# Patient Record
Sex: Male | Born: 1940 | Hispanic: No | Marital: Single | Smoking: Former smoker
Health system: Southern US, Community
[De-identification: ages and names within clinical notes are randomized; demographics above are authoritative.]

## PROBLEM LIST (undated history)

## (undated) DIAGNOSIS — G14 Postpolio syndrome: Secondary | ICD-10-CM

## (undated) DIAGNOSIS — E785 Hyperlipidemia, unspecified: Secondary | ICD-10-CM

## (undated) DIAGNOSIS — I10 Essential (primary) hypertension: Secondary | ICD-10-CM

## (undated) DIAGNOSIS — D649 Anemia, unspecified: Secondary | ICD-10-CM

## (undated) DIAGNOSIS — I2510 Atherosclerotic heart disease of native coronary artery without angina pectoris: Secondary | ICD-10-CM

## (undated) HISTORY — DX: Anemia, unspecified: D64.9

## (undated) HISTORY — DX: Essential (primary) hypertension: I10

## (undated) HISTORY — DX: Hyperlipidemia, unspecified: E78.5

## (undated) HISTORY — DX: Postpolio syndrome: G14

## (undated) HISTORY — PX: OTHER SURGICAL HISTORY: SHX169

## (undated) HISTORY — DX: Atherosclerotic heart disease of native coronary artery without angina pectoris: I25.10

---

## 2007-07-10 ENCOUNTER — Encounter: Payer: Self-pay | Admitting: Cardiology

## 2009-06-15 ENCOUNTER — Encounter: Payer: Self-pay | Admitting: Cardiology

## 2009-09-02 ENCOUNTER — Encounter: Payer: Self-pay | Admitting: Cardiology

## 2009-09-29 ENCOUNTER — Encounter: Payer: Self-pay | Admitting: Cardiology

## 2010-06-06 ENCOUNTER — Ambulatory Visit: Payer: Self-pay | Admitting: Cardiology

## 2010-06-06 ENCOUNTER — Encounter: Payer: Self-pay | Admitting: Cardiology

## 2010-06-06 DIAGNOSIS — I2510 Atherosclerotic heart disease of native coronary artery without angina pectoris: Secondary | ICD-10-CM

## 2010-06-06 DIAGNOSIS — I10 Essential (primary) hypertension: Secondary | ICD-10-CM | POA: Insufficient documentation

## 2010-06-06 DIAGNOSIS — E782 Mixed hyperlipidemia: Secondary | ICD-10-CM

## 2010-06-13 ENCOUNTER — Ambulatory Visit: Payer: Self-pay | Admitting: Cardiology

## 2010-06-20 LAB — CONVERTED CEMR LAB
ALT: 15 units/L (ref 0–53)
AST: 19 units/L (ref 0–37)
BUN: 32 mg/dL — ABNORMAL HIGH (ref 6–23)
Bilirubin, Direct: 0.1 mg/dL (ref 0.0–0.3)
CO2: 26 meq/L (ref 19–32)
Calcium: 9.2 mg/dL (ref 8.4–10.5)
GFR calc non Af Amer: 47.02 mL/min — ABNORMAL LOW (ref >60.00)
Glucose, Bld: 81 mg/dL (ref 70–99)
Sodium: 145 meq/L (ref 135–145)
Total Bilirubin: 0.4 mg/dL (ref 0.3–1.2)
Total CHOL/HDL Ratio: 3

## 2010-08-02 NOTE — Assessment & Plan Note (Signed)
Summary: NP6/ H/O PRIOR MI , PRIOR LAD STENTING. PT HAS MEDICARE. GD   Visit Type:  new pt visit Primary Provider:  Dr. Leilani Able  CC:  pt offers no cardiac complaints today.  History of Present Illness: 69 yo with history of CAD s/p LAD PCI and diabetes mellitus presents to establish cardiology care after moving to [facility]  Patient had a NSTEMI Filbert 2008 with tandem severe LAD stenoses that were treated with Xience drug eluting stents.  Since that time, he has done well.  Last myoview was Kemoni 3/11 and showed no evidence of ischemia or infarction per report.  Patient has had no recent chest pain.  He has no exertional dyspnea but is not particularly active.  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  He rides a stationary bike and occasionally plays golf.    ECG: NSR, normal  Labs (12/10): LDL 54, HDL 54, creatinine 1.6  Current Medications (verified): 1)  Plavix 75 Mg Tabs (Clopidogrel Bisulfate) .[name] Kitchen.. 1 Tab Once Daily 2)  Onglyza 2.5 Mg Tabs (Saxagliptin Hcl) .[name] Kitchen.. 1 Tab Once Daily 3)  Aspirin 81 Mg Tbec (Aspirin) .... Take One Tablet By Mouth Daily 4)  Amlodipine Besy-Benazepril Hcl 5-20 Mg Caps (Amlodipine Besy-Benazepril Hcl) .[name] Kitchen.. 1 Cap Once Daily 5)  Metoprolol Tartrate 50 Mg Tabs (Metoprolol Tartrate) .[name] Kitchen.. 1 Tab Once Daily 6)  Glipizide 5 Mg Tabs (Glipizide) .[name] Kitchen.. 1 Tab Once Daily 7)  Simvastatin 20 Mg Tabs (Simvastatin) .[name] Kitchen.. 1 Tab Once Daily  Allergies (verified): No Known Drug Allergies  Past History:  Past Medical History: 1. CAD: NSTEMI 2008 with tandem severe mid LAD stenoses treated with Xience V drug eluting stents.  Lexiscan myovew Jeshurun 3/11 showed EF 58%, no ischemia or infarction.  2. Hyperlipidemia 3. Diabetes mellitus 4. Post-polio syndrome with leg weakness 5. HTN  Family History: No premature CAD  Social History: Lives [place] with family Retired from [employer]  Review of Systems       All systems reviewed and  negative except as per HPI.   Vital Signs:  Patient profile:   70 year old male Height:      65 inches Weight:      145.50 pounds BMI:     24.30 Pulse rate:   70 / minute Pulse rhythm:   regular BP sitting:   144 / 70  (left arm) Cuff size:   regular  Vitals Entered By: Danielle Rankin, CMA (June 06, 2010 11:56 AM)  Physical Exam  General:  Well developed, well nourished, Peniel no acute distress. Head:  normocephalic and atraumatic Nose:  no deformity, discharge, inflammation, or lesions Mouth:  Teeth, gums and palate normal. Oral mucosa normal. Neck:  Neck supple, no JVD. No masses, thyromegaly or abnormal cervical nodes. Lungs:  Clear bilaterally to auscultation and percussion. Heart:  Non-displaced PMI, chest non-tender; regular rate and rhythm, S1, S2 without murmurs, rubs or gallops. Carotid upstroke normal, no bruit.  Pedals normal pulses. No edema, no varicosities. Abdomen:  Bowel sounds positive; abdomen soft and non-tender without masses, organomegaly, or hernias noted. No hepatosplenomegaly. Msk:  lower leg braces Extremities:  No clubbing or cyanosis. Neurologic:  Alert and oriented x 3. Skin:  Intact without lesions or rashes. Psych:  Normal affect.   Impression & Recommendations:  Problem # 1:  CAD, NATIVE VESSEL (ICD-414.01) Patient had NSTEMI with 2 drug eluting stents Arihaan the mid LAD.  His prior cardiologist recommended that he continue Plavix long-term (has been  on it since 2008), so I will continue it at this time.  No ischemic symptoms.  Normal Lexiscan myoview Dominico 3/11.  Preserved EF.  I will continue current meds: ASA, Plavix, ACEI, statin, metoprolol.   Problem # 2:  HYPERLIPIDEMIA-MIXED (ICD-272.4) Check lipids/LFTs, goal LDL < 70.   Problem # 3:  HYPERTENSION, UNSPECIFIED (ICD-401.9) BP is running on the high side.  He has only been taking metoprolol 50 mg once daily.  This should be increased to twice daily.   Patient Instructions: 1)  Your physician  has recommended you make the following change Jazier your medication:  2)  Take Metoprolol 50mg  twice a day. 3)  Your physician recommends that you return for a FASTING lipid profile/liver profile/BMP 414.01 4)  Your physician wants you to follow-up Sailor: 6 months with Dr Shirlee Latch. Joycie Peek 2012)  You will receive a reminder letter Danyael the mail two months [place] advance. If you don't receive a letter, please call our office to schedule the follow-up appointment. Prescriptions: SIMVASTATIN 20 MG TABS (SIMVASTATIN) 1 tab once daily  #30 x 6   Entered by:   Katina Dung, RN, BSN   Authorized by:   Marca Ancona, MD   Signed by:   Katina Dung, RN, BSN on 06/06/2010   Method used:   Electronically to        [facility] (retail)       [phone]       [place], [place]  [address]       Ph: [phone]       Fax: [phone]   RxID:   [id number] AMLODIPINE BESY-BENAZEPRIL HCL 5-20 MG CAPS (AMLODIPINE BESY-BENAZEPRIL HCL) 1 cap once daily  #30 x 6   Entered by:   Katina Dung, RN, BSN   Authorized by:   Marca Ancona, MD   Signed by:   Katina Dung, RN, BSN on 06/06/2010   Method used:   Electronically to        [facility] (retail)       [phone]       [place], [place]  [address]       Ph: [phone]       Fax: [phone]   RxID:   [id number] PLAVIX 75 MG TABS (CLOPIDOGREL BISULFATE) 1 tab once daily  #30 x 6   Entered by:   Katina Dung, RN, BSN   Authorized by:   Marca Ancona, MD   Signed by:   Katina Dung, RN, BSN on 06/06/2010   Method used:   Electronically to        [facility] (retail)       [phone]       [place], [place]  [address]       Ph: [phone]       Fax: [phone]   RxID:   [id number] METOPROLOL TARTRATE 50 MG TABS (METOPROLOL TARTRATE) one twice a day  #60 x 6   Entered by:   Katina Dung, RN, BSN    Authorized by:   Marca Ancona, MD   Signed by:   Katina Dung, RN, BSN on 06/06/2010   Method used:   Electronically to        [facility] (retail)       [phone]  [place], [place]  [address]       Ph: [phone]       Fax: [phone]   RxID:   [id number]

## 2010-08-04 NOTE — Letter (Signed)
Summary: [facility] 2008 to 2011  [facility] 2008 to 2011   Imported By: Marylou Mccoy 06/16/2010 12:44:48  _____________________________________________________________________  External Attachment:    Type:   Image     Comment:   External Document

## 2010-08-04 NOTE — Letter (Signed)
Summary: [facility] Office Visit Note   [facility] Office Visit Note   Imported By: Roderic Ovens 06/24/2010 13:55:19  _____________________________________________________________________  External Attachment:    Type:   Image     Comment:   External Document

## 2010-12-02 ENCOUNTER — Encounter: Payer: Self-pay | Admitting: Cardiology

## 2010-12-20 ENCOUNTER — Encounter: Payer: Self-pay | Admitting: Cardiology

## 2010-12-20 ENCOUNTER — Ambulatory Visit (INDEPENDENT_AMBULATORY_CARE_PROVIDER_SITE_OTHER): Payer: Medicare Other | Admitting: Cardiology

## 2010-12-20 VITALS — BP 144/78 | HR 58 | Resp 18 | Ht 65.0 in | Wt 148.8 lb

## 2010-12-20 DIAGNOSIS — E785 Hyperlipidemia, unspecified: Secondary | ICD-10-CM

## 2010-12-20 DIAGNOSIS — I2510 Atherosclerotic heart disease of native coronary artery without angina pectoris: Secondary | ICD-10-CM

## 2010-12-20 DIAGNOSIS — I10 Essential (primary) hypertension: Secondary | ICD-10-CM

## 2010-12-20 NOTE — Patient Instructions (Signed)
Start Fish Oil 2000mg  daily--this will be two 1000mg  capsules daily.  Start  a multiple vitamin every day.  Schedule an appointment for fasting lab Henry Turner the next week or so--lipid profile/BMP 414.01  Your physician wants you to follow-up Henry Turner: 1 year with Henry Turner. (June 2013). You will receive a reminder letter Henry [place] the mail two months Henry Turner advance. If you don't receive a letter, please call our office to schedule the follow-up appointment.

## 2010-12-22 NOTE — Assessment & Plan Note (Signed)
Patient had NSTEMI with 2 drug eluting stents Henry Turner the mid LAD.  His prior cardiologist recommended that he continue Plavix long-term (has been on it since 2008), so I will continue it at this time.  No ischemic symptoms.  Normal Lexiscan myoview Brandn 3/11.  Preserved EF.  I will continue current meds: ASA, Plavix, ACEI, statin, metoprolol.

## 2010-12-22 NOTE — Assessment & Plan Note (Signed)
Mild BP elevation today, good control at home.  Continue current meds.

## 2010-12-22 NOTE — Assessment & Plan Note (Signed)
Check lipids, goal LDL < 70.  

## 2010-12-22 NOTE — Progress Notes (Signed)
PCP: Dr. Pecola Leisure  70 yo with history of CAD s/p LAD PCI and diabetes mellitus presents for cardiology followup.  Patient had a NSTEMI Edu 2008 with tandem severe LAD stenoses that were treated with Xience drug eluting stents.  Since that time, he has done well.  Last myoview was [name] 3/11 and showed no evidence of ischemia or infarction per report.  Patient has had no recent chest pain.  He has no exertional dyspnea.  He rides a stationary bike and plays golf.  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  BP is borderline elevated today but systolic BP runs 161W-960A at home.  ECG: NSR, mild bradycardia with rate 50.    Labs (12/10): LDL 54, HDL 54, creatinine 1.6 Labs (12/11): LDL 68, HDL 39, K 4.9, creatinine 1.6  Allergies (verified):  No Known Drug Allergies  Past Medical History: 1. CAD: NSTEMI 2008 with tandem severe mid LAD stenoses treated with Xience V drug eluting stents.  Lexiscan myovew [name] 3/11 showed EF 58%, no ischemia or infarction.  2. Hyperlipidemia 3. Diabetes mellitus 4. Post-polio syndrome with leg weakness 5. HTN 6. CKD: Creatinine around 1.6 chronically.   Family History: No premature CAD  Social History: Lives [place] with family Retired from [employer]  Current Outpatient Prescriptions  Medication Sig Dispense Refill  . amLODipine-benazepril (LOTREL) 5-20 MG per capsule Take 1 capsule by mouth daily.        [name] Kitchen aspirin 81 MG tablet Take 81 mg by mouth daily.        . clopidogrel (PLAVIX) 75 MG tablet Take 75 mg by mouth daily.        [name] Kitchen glipiZIDE (GLUCOTROL) 5 MG tablet Take 5 mg by mouth daily.        . metoprolol (LOPRESSOR) 50 MG tablet Take 50 mg by mouth 2 (two) times daily.        . saxagliptin HCl (ONGLYZA) 2.5 MG TABS tablet Take 2.5 mg by mouth daily.        . simvastatin (ZOCOR) 20 MG tablet Take 20 mg by mouth daily.        . fish oil-omega-3 fatty acids 1000 MG capsule Take 2 capsules (2 g total) by mouth daily.      .  Multiple Vitamins tablet Take 1 tablet by mouth daily.        BP 144/78  Pulse 58  Resp 18  Ht 5\' 5"  (1.651 m)  Wt 148 lb 12.8 oz (67.495 kg)  BMI 24.76 kg/m2 General: NAD Neck: No JVD, no thyromegaly or thyroid nodule.  Lungs: Clear to auscultation bilaterally with normal respiratory effort. CV: Nondisplaced PMI.  Heart regular S1/S2, no S3/S4, no murmur.  No peripheral edema.  No carotid bruit.  Normal pedal pulses.  Abdomen: Soft, nontender, no hepatosplenomegaly, no distention.  Neurologic: Alert and oriented x 3.  Psych: Normal affect. Extremities: No clubbing or cyanosis.

## 2010-12-27 ENCOUNTER — Telehealth: Payer: Self-pay | Admitting: *Deleted

## 2010-12-27 ENCOUNTER — Other Ambulatory Visit (INDEPENDENT_AMBULATORY_CARE_PROVIDER_SITE_OTHER): Payer: Medicare Other | Admitting: *Deleted

## 2010-12-27 ENCOUNTER — Other Ambulatory Visit: Payer: Medicare Other

## 2010-12-27 DIAGNOSIS — I2510 Atherosclerotic heart disease of native coronary artery without angina pectoris: Secondary | ICD-10-CM

## 2010-12-27 DIAGNOSIS — E875 Hyperkalemia: Secondary | ICD-10-CM

## 2010-12-27 DIAGNOSIS — R0989 Other specified symptoms and signs involving the circulatory and respiratory systems: Secondary | ICD-10-CM

## 2010-12-27 LAB — BASIC METABOLIC PANEL
BUN: 41 mg/dL — ABNORMAL HIGH (ref 6–23)
CO2: 27 mEq/L (ref 19–32)
Chloride: 108 mEq/L (ref 96–112)
Chloride: 109 mEq/L (ref 96–112)
Creatinine, Ser: 1.9 mg/dL — ABNORMAL HIGH (ref 0.4–1.5)
GFR: 36.07 mL/min — ABNORMAL LOW (ref >60.00)
Glucose, Bld: 236 mg/dL — ABNORMAL HIGH (ref 70–99)
Potassium: 6.2 mEq/L (ref 3.5–5.1)
Potassium: 5.2 mEq/L — ABNORMAL HIGH (ref 3.5–5.1)
Sodium: 141 mEq/L (ref 135–145)
Sodium: 142 mEq/L (ref 135–145)

## 2010-12-27 LAB — LIPID PANEL
LDL Cholesterol: 65 mg/dL (ref 0–99)
Total CHOL/HDL Ratio: 3
Triglycerides: 91 mg/dL (ref 0.0–149.0)

## 2010-12-27 NOTE — Telephone Encounter (Signed)
Notes Recorded by Jacqlyn Krauss, RN on 12/27/2010 at 2:47 PM I talked with Clydie Braun at [facility]. Pt can return for repeat STAT BMP to [facility]. I talked with pt. Pt states he will return to [facility] now. It will take him about 20 minutes to get there. He will hold Amlodipine/benazepril. Notes Recorded by Marca Ancona, MD on 12/27/2010 at 2:37 PM K 6.2 and creatinine 1.9. Re-run BMET stat. Hold benazepril. Will need to be treated with Kayexalate and get ECG if this is truly high.

## 2010-12-28 ENCOUNTER — Telehealth: Payer: Self-pay | Admitting: Cardiology

## 2010-12-28 DIAGNOSIS — I10 Essential (primary) hypertension: Secondary | ICD-10-CM

## 2010-12-28 DIAGNOSIS — I2510 Atherosclerotic heart disease of native coronary artery without angina pectoris: Secondary | ICD-10-CM

## 2010-12-28 MED ORDER — AMLODIPINE BESYLATE 10 MG PO TABS: 10.0000 mg | ORAL_TABLET | Freq: Every day | ORAL | Status: DC

## 2010-12-28 NOTE — Telephone Encounter (Signed)
tes Recorded by Jacqlyn Krauss, RN on 12/28/2010 at 3:05 PM I talked with pt. He will stop amlodipine/benazepril and start amlodipine 10mg  daily. He will return for [facility] 01/11/11. Notes Recorded by Jacqlyn Krauss, RN on 12/28/2010 at 10:50 AM I talked with pt. He was unsure of the medication he should stop. He will call me back today when he is at home so I can review his medication with him. Notes Recorded by Marca Ancona, MD on 12/28/2010 at 8:54 AM Stop amlodipine/benazepril, start amlodipine 10 mg daily. Repeat BMET Seymour 2 wks

## 2010-12-28 NOTE — Telephone Encounter (Signed)
Message copied by Jacqlyn Krauss on Wed Dec 28, 2010  3:06 PM ------      Message from: Laurey Morale      Created: Wed Dec 28, 2010  8:54 AM       Stop amlodipine/benazepril, start amlodipine 10 mg daily.  Repeat BMET Shawnn 2 wks.

## 2010-12-28 NOTE — Telephone Encounter (Signed)
Retuning call back to anne.

## 2011-01-11 ENCOUNTER — Other Ambulatory Visit (INDEPENDENT_AMBULATORY_CARE_PROVIDER_SITE_OTHER): Payer: Medicare Other | Admitting: *Deleted

## 2011-01-11 ENCOUNTER — Other Ambulatory Visit: Payer: Self-pay | Admitting: Cardiology

## 2011-01-11 DIAGNOSIS — I10 Essential (primary) hypertension: Secondary | ICD-10-CM

## 2011-01-11 DIAGNOSIS — I2510 Atherosclerotic heart disease of native coronary artery without angina pectoris: Secondary | ICD-10-CM

## 2011-01-11 LAB — BASIC METABOLIC PANEL
CO2: 28 mEq/L (ref 19–32)
Calcium: 9.3 mg/dL (ref 8.4–10.5)
Glucose, Bld: 81 mg/dL (ref 70–99)
Sodium: 143 mEq/L (ref 135–145)

## 2011-01-23 ENCOUNTER — Other Ambulatory Visit: Payer: Self-pay | Admitting: Cardiology

## 2011-04-19 ENCOUNTER — Other Ambulatory Visit: Payer: Self-pay | Admitting: Family Medicine

## 2011-04-19 DIAGNOSIS — M79606 Pain in leg, unspecified: Secondary | ICD-10-CM

## 2011-04-20 ENCOUNTER — Ambulatory Visit
Admission: RE | Admit: 2011-04-20 | Discharge: 2011-04-20 | Disposition: A | Discharge status: Home / Self Care | Payer: Medicare Other | Source: Ambulatory Visit | Attending: Family Medicine | Admitting: Family Medicine

## 2011-04-20 DIAGNOSIS — M79606 Pain in leg, unspecified: Secondary | ICD-10-CM

## 2011-05-30 ENCOUNTER — Telehealth: Payer: Self-pay | Admitting: Cardiology

## 2011-05-30 NOTE — Telephone Encounter (Signed)
LOv,Labs faxed to [facility] @ [phone]  05/30/11/km

## 2011-05-31 ENCOUNTER — Telehealth: Payer: Self-pay | Admitting: Cardiology

## 2011-05-31 NOTE — Telephone Encounter (Signed)
All Cardiac faxed to [facility]/Dr.S. Timmapuri @ [phone]  05/31/11/km

## 2011-06-24 DIAGNOSIS — E119 Type 2 diabetes mellitus without complications: Secondary | ICD-10-CM | POA: Diagnosis present

## 2011-06-24 DIAGNOSIS — Z5189 Encounter for other specified aftercare: Secondary | ICD-10-CM | POA: Diagnosis not present

## 2011-06-24 DIAGNOSIS — B91 Sequelae of poliomyelitis: Secondary | ICD-10-CM | POA: Diagnosis not present

## 2011-06-24 DIAGNOSIS — I2510 Atherosclerotic heart disease of native coronary artery without angina pectoris: Secondary | ICD-10-CM | POA: Diagnosis not present

## 2011-06-24 DIAGNOSIS — Z981 Arthrodesis status: Secondary | ICD-10-CM | POA: Diagnosis not present

## 2011-06-24 DIAGNOSIS — M216X9 Other acquired deformities of unspecified foot: Secondary | ICD-10-CM | POA: Diagnosis not present

## 2011-06-24 DIAGNOSIS — M21549 Acquired clubfoot, unspecified foot: Secondary | ICD-10-CM | POA: Diagnosis not present

## 2011-06-24 DIAGNOSIS — Z8612 Personal history of poliomyelitis: Secondary | ICD-10-CM | POA: Diagnosis not present

## 2011-06-24 DIAGNOSIS — J449 Chronic obstructive pulmonary disease, unspecified: Secondary | ICD-10-CM | POA: Diagnosis present

## 2011-06-24 DIAGNOSIS — Z23 Encounter for immunization: Secondary | ICD-10-CM | POA: Diagnosis not present

## 2011-06-24 DIAGNOSIS — A809 Acute poliomyelitis, unspecified: Secondary | ICD-10-CM | POA: Diagnosis present

## 2011-06-24 DIAGNOSIS — M6281 Muscle weakness (generalized): Secondary | ICD-10-CM | POA: Diagnosis present

## 2011-06-24 DIAGNOSIS — I10 Essential (primary) hypertension: Secondary | ICD-10-CM | POA: Diagnosis present

## 2011-06-24 DIAGNOSIS — Z87891 Personal history of nicotine dependence: Secondary | ICD-10-CM | POA: Diagnosis not present

## 2011-06-24 DIAGNOSIS — M2420 Disorder of ligament, unspecified site: Secondary | ICD-10-CM | POA: Diagnosis not present

## 2011-06-24 DIAGNOSIS — E1149 Type 2 diabetes mellitus with other diabetic neurological complication: Secondary | ICD-10-CM | POA: Diagnosis not present

## 2011-06-24 DIAGNOSIS — E785 Hyperlipidemia, unspecified: Secondary | ICD-10-CM | POA: Diagnosis present

## 2011-06-24 DIAGNOSIS — I252 Old myocardial infarction: Secondary | ICD-10-CM | POA: Diagnosis present

## 2011-06-24 DIAGNOSIS — R262 Difficulty in walking, not elsewhere classified: Secondary | ICD-10-CM | POA: Diagnosis present

## 2011-06-24 DIAGNOSIS — R937 Abnormal findings on diagnostic imaging of other parts of musculoskeletal system: Secondary | ICD-10-CM | POA: Diagnosis not present

## 2011-06-24 DIAGNOSIS — Z9861 Coronary angioplasty status: Secondary | ICD-10-CM | POA: Diagnosis not present

## 2011-07-09 DIAGNOSIS — Z5189 Encounter for other specified aftercare: Secondary | ICD-10-CM | POA: Diagnosis not present

## 2011-07-09 DIAGNOSIS — B91 Sequelae of poliomyelitis: Secondary | ICD-10-CM | POA: Diagnosis not present

## 2011-07-09 DIAGNOSIS — M6281 Muscle weakness (generalized): Secondary | ICD-10-CM | POA: Diagnosis present

## 2011-07-09 DIAGNOSIS — R262 Difficulty in walking, not elsewhere classified: Secondary | ICD-10-CM | POA: Diagnosis present

## 2011-07-09 DIAGNOSIS — I252 Old myocardial infarction: Secondary | ICD-10-CM | POA: Diagnosis present

## 2011-07-09 DIAGNOSIS — Z23 Encounter for immunization: Secondary | ICD-10-CM | POA: Diagnosis not present

## 2011-07-09 DIAGNOSIS — Z4789 Encounter for other orthopedic aftercare: Secondary | ICD-10-CM | POA: Diagnosis not present

## 2011-07-09 DIAGNOSIS — R279 Unspecified lack of coordination: Secondary | ICD-10-CM | POA: Diagnosis not present

## 2011-07-09 DIAGNOSIS — J4489 Other specified chronic obstructive pulmonary disease: Secondary | ICD-10-CM | POA: Diagnosis present

## 2011-07-09 DIAGNOSIS — Z981 Arthrodesis status: Secondary | ICD-10-CM | POA: Diagnosis not present

## 2011-07-09 DIAGNOSIS — E7800 Pure hypercholesterolemia, unspecified: Secondary | ICD-10-CM | POA: Diagnosis not present

## 2011-07-09 DIAGNOSIS — E119 Type 2 diabetes mellitus without complications: Secondary | ICD-10-CM | POA: Diagnosis not present

## 2011-07-09 DIAGNOSIS — R269 Unspecified abnormalities of gait and mobility: Secondary | ICD-10-CM | POA: Diagnosis not present

## 2011-07-09 DIAGNOSIS — J449 Chronic obstructive pulmonary disease, unspecified: Secondary | ICD-10-CM | POA: Diagnosis not present

## 2011-07-09 DIAGNOSIS — M7989 Other specified soft tissue disorders: Secondary | ICD-10-CM | POA: Diagnosis not present

## 2011-07-09 DIAGNOSIS — A809 Acute poliomyelitis, unspecified: Secondary | ICD-10-CM | POA: Diagnosis present

## 2011-07-09 DIAGNOSIS — R5383 Other fatigue: Secondary | ICD-10-CM | POA: Diagnosis not present

## 2011-07-09 DIAGNOSIS — I2510 Atherosclerotic heart disease of native coronary artery without angina pectoris: Secondary | ICD-10-CM | POA: Diagnosis not present

## 2011-07-09 DIAGNOSIS — I10 Essential (primary) hypertension: Secondary | ICD-10-CM | POA: Diagnosis not present

## 2011-07-12 DIAGNOSIS — Z4789 Encounter for other orthopedic aftercare: Secondary | ICD-10-CM | POA: Diagnosis not present

## 2011-07-17 DIAGNOSIS — Z981 Arthrodesis status: Secondary | ICD-10-CM | POA: Diagnosis not present

## 2011-07-17 DIAGNOSIS — R279 Unspecified lack of coordination: Secondary | ICD-10-CM | POA: Diagnosis not present

## 2011-07-17 DIAGNOSIS — M6281 Muscle weakness (generalized): Secondary | ICD-10-CM | POA: Diagnosis not present

## 2011-07-17 DIAGNOSIS — R269 Unspecified abnormalities of gait and mobility: Secondary | ICD-10-CM | POA: Diagnosis not present

## 2011-07-19 DIAGNOSIS — Z4789 Encounter for other orthopedic aftercare: Secondary | ICD-10-CM | POA: Diagnosis not present

## 2011-07-31 DIAGNOSIS — R279 Unspecified lack of coordination: Secondary | ICD-10-CM | POA: Diagnosis not present

## 2011-07-31 DIAGNOSIS — Z981 Arthrodesis status: Secondary | ICD-10-CM | POA: Diagnosis not present

## 2011-07-31 DIAGNOSIS — R269 Unspecified abnormalities of gait and mobility: Secondary | ICD-10-CM | POA: Diagnosis not present

## 2011-07-31 DIAGNOSIS — M6281 Muscle weakness (generalized): Secondary | ICD-10-CM | POA: Diagnosis not present

## 2011-08-02 DIAGNOSIS — I10 Essential (primary) hypertension: Secondary | ICD-10-CM | POA: Diagnosis not present

## 2011-08-02 DIAGNOSIS — Z4789 Encounter for other orthopedic aftercare: Secondary | ICD-10-CM | POA: Diagnosis not present

## 2011-08-02 DIAGNOSIS — E7800 Pure hypercholesterolemia, unspecified: Secondary | ICD-10-CM | POA: Diagnosis not present

## 2011-08-02 DIAGNOSIS — B91 Sequelae of poliomyelitis: Secondary | ICD-10-CM | POA: Diagnosis not present

## 2011-08-02 DIAGNOSIS — M7989 Other specified soft tissue disorders: Secondary | ICD-10-CM | POA: Diagnosis not present

## 2011-08-16 DIAGNOSIS — IMO0002 Reserved for concepts with insufficient information to code with codable children: Secondary | ICD-10-CM | POA: Diagnosis not present

## 2011-08-16 DIAGNOSIS — M7989 Other specified soft tissue disorders: Secondary | ICD-10-CM | POA: Diagnosis not present

## 2011-08-16 DIAGNOSIS — Z981 Arthrodesis status: Secondary | ICD-10-CM | POA: Diagnosis not present

## 2011-08-16 DIAGNOSIS — M949 Disorder of cartilage, unspecified: Secondary | ICD-10-CM | POA: Diagnosis not present

## 2011-08-16 DIAGNOSIS — Z4889 Encounter for other specified surgical aftercare: Secondary | ICD-10-CM | POA: Diagnosis not present

## 2011-08-16 DIAGNOSIS — M899 Disorder of bone, unspecified: Secondary | ICD-10-CM | POA: Diagnosis not present

## 2011-08-26 DIAGNOSIS — Z0000 Encounter for general adult medical examination without abnormal findings: Secondary | ICD-10-CM | POA: Diagnosis not present

## 2011-08-26 DIAGNOSIS — I2510 Atherosclerotic heart disease of native coronary artery without angina pectoris: Secondary | ICD-10-CM | POA: Diagnosis not present

## 2011-08-26 DIAGNOSIS — E0500 Thyrotoxicosis with diffuse goiter without thyrotoxic crisis or storm: Secondary | ICD-10-CM | POA: Diagnosis not present

## 2011-08-26 DIAGNOSIS — I10 Essential (primary) hypertension: Secondary | ICD-10-CM | POA: Diagnosis not present

## 2011-08-26 DIAGNOSIS — N400 Enlarged prostate without lower urinary tract symptoms: Secondary | ICD-10-CM | POA: Diagnosis not present

## 2011-08-26 DIAGNOSIS — D649 Anemia, unspecified: Secondary | ICD-10-CM | POA: Diagnosis not present

## 2011-08-26 DIAGNOSIS — E119 Type 2 diabetes mellitus without complications: Secondary | ICD-10-CM | POA: Diagnosis not present

## 2011-08-26 DIAGNOSIS — E785 Hyperlipidemia, unspecified: Secondary | ICD-10-CM | POA: Diagnosis not present

## 2011-08-26 DIAGNOSIS — Z125 Encounter for screening for malignant neoplasm of prostate: Secondary | ICD-10-CM | POA: Diagnosis not present

## 2011-08-26 DIAGNOSIS — N419 Inflammatory disease of prostate, unspecified: Secondary | ICD-10-CM | POA: Diagnosis not present

## 2011-09-06 DIAGNOSIS — Z0189 Encounter for other specified special examinations: Secondary | ICD-10-CM | POA: Diagnosis not present

## 2011-09-06 DIAGNOSIS — M949 Disorder of cartilage, unspecified: Secondary | ICD-10-CM | POA: Diagnosis not present

## 2011-09-06 DIAGNOSIS — E119 Type 2 diabetes mellitus without complications: Secondary | ICD-10-CM | POA: Diagnosis not present

## 2011-09-06 DIAGNOSIS — Z9861 Coronary angioplasty status: Secondary | ICD-10-CM | POA: Diagnosis not present

## 2011-09-06 DIAGNOSIS — Z981 Arthrodesis status: Secondary | ICD-10-CM | POA: Diagnosis not present

## 2011-09-06 DIAGNOSIS — M899 Disorder of bone, unspecified: Secondary | ICD-10-CM | POA: Diagnosis not present

## 2011-09-06 DIAGNOSIS — E785 Hyperlipidemia, unspecified: Secondary | ICD-10-CM | POA: Diagnosis not present

## 2011-09-06 DIAGNOSIS — Z87891 Personal history of nicotine dependence: Secondary | ICD-10-CM | POA: Diagnosis not present

## 2011-09-06 DIAGNOSIS — I10 Essential (primary) hypertension: Secondary | ICD-10-CM | POA: Diagnosis not present

## 2011-10-11 DIAGNOSIS — Z981 Arthrodesis status: Secondary | ICD-10-CM | POA: Diagnosis not present

## 2011-10-11 DIAGNOSIS — R972 Elevated prostate specific antigen [PSA]: Secondary | ICD-10-CM | POA: Diagnosis not present

## 2011-10-11 DIAGNOSIS — Z4789 Encounter for other orthopedic aftercare: Secondary | ICD-10-CM | POA: Diagnosis not present

## 2011-10-11 DIAGNOSIS — M949 Disorder of cartilage, unspecified: Secondary | ICD-10-CM | POA: Diagnosis not present

## 2011-10-11 DIAGNOSIS — M216X9 Other acquired deformities of unspecified foot: Secondary | ICD-10-CM | POA: Diagnosis not present

## 2011-10-18 ENCOUNTER — Telehealth: Payer: Self-pay | Admitting: Cardiology

## 2011-10-18 NOTE — Telephone Encounter (Signed)
New Problem:     I called the patient and learned that he is [place] having surgery and does not know when he will be back.  Patient said that he would call back to reschedule his appointment when he comes back.  Please call back if you have any concerns.

## 2011-10-26 ENCOUNTER — Telehealth: Payer: Self-pay | Admitting: Cardiology

## 2011-10-26 NOTE — Telephone Encounter (Signed)
Patient's daughter Neomia Glass stated patient is having swelling Montrelle lower legs and thinks amlodipine is causing.Fowarded to Dr.Mclean for advice

## 2011-10-26 NOTE — Telephone Encounter (Signed)
New Problem:     Patient's daughter called Mary because her father believe's that his amLODipine (NORVASC) 10 MG tablet is causing swelling Hasson his leg.  Patient would like to reduce his dose for the time being.  Please call back.

## 2011-10-27 NOTE — Telephone Encounter (Signed)
OK to cut back to 5 mg but check BP daily or every other day and call us Zamarian 2 weeks with readings.

## 2011-10-30 NOTE — Telephone Encounter (Signed)
LMTCB

## 2011-10-30 NOTE — Telephone Encounter (Signed)
Patient's daughter called no answer.LMTC. 

## 2011-11-14 NOTE — Telephone Encounter (Signed)
I spoke with pt today. He was going to ask his daughter to call and discuss with me.

## 2011-11-22 DIAGNOSIS — M216X9 Other acquired deformities of unspecified foot: Secondary | ICD-10-CM | POA: Diagnosis not present

## 2012-01-31 DIAGNOSIS — M216X9 Other acquired deformities of unspecified foot: Secondary | ICD-10-CM | POA: Diagnosis not present

## 2012-02-13 ENCOUNTER — Telehealth: Payer: Self-pay | Admitting: Cardiology

## 2012-02-13 NOTE — Telephone Encounter (Signed)
New Problem:    I called the patient and, after being told that the patient was [place] and was not sure when he would be back, opted to wait until the patient returned to town to schedule and appointment to see his cardiologists.

## 2012-04-03 ENCOUNTER — Ambulatory Visit (INDEPENDENT_AMBULATORY_CARE_PROVIDER_SITE_OTHER): Payer: Medicare Other | Admitting: Cardiology

## 2012-04-03 ENCOUNTER — Encounter: Payer: Self-pay | Admitting: Cardiology

## 2012-04-03 VITALS — BP 132/66 | HR 63 | Ht 65.0 in | Wt 143.8 lb

## 2012-04-03 DIAGNOSIS — R0602 Shortness of breath: Secondary | ICD-10-CM | POA: Diagnosis not present

## 2012-04-03 DIAGNOSIS — I2510 Atherosclerotic heart disease of native coronary artery without angina pectoris: Secondary | ICD-10-CM | POA: Diagnosis not present

## 2012-04-03 DIAGNOSIS — N189 Chronic kidney disease, unspecified: Secondary | ICD-10-CM | POA: Insufficient documentation

## 2012-04-03 DIAGNOSIS — E785 Hyperlipidemia, unspecified: Secondary | ICD-10-CM

## 2012-04-03 DIAGNOSIS — R609 Edema, unspecified: Secondary | ICD-10-CM | POA: Diagnosis not present

## 2012-04-03 DIAGNOSIS — R600 Localized edema: Secondary | ICD-10-CM | POA: Insufficient documentation

## 2012-04-03 DIAGNOSIS — I10 Essential (primary) hypertension: Secondary | ICD-10-CM

## 2012-04-03 NOTE — Patient Instructions (Signed)
Your physician has requested that you have an echocardiogram. Echocardiography is a painless test that uses sound waves to create images of your heart. It provides your doctor with information about the size and shape of your heart and how well your heart's chambers and valves are working. This procedure takes approximately one hour. There are no restrictions for this procedure.  Your physician recommends that you return for a FASTING lipid profile /liver profile/BMET/BNP.    Use knee high compression stockings to see if it helps the swelling Chaney your legs. I have given you a prescription.  Put them on Lory the morning and take them off at night.     You have been referred to [facility]--Dr Norins or Dr Yetta Barre.   Your physician wants you to follow-up Dareon: 6 months with Dr Shirlee Latch. (April 2014).  You will receive a reminder letter Mehran the mail two months [place] advance. If you don't receive a letter, please call our office to schedule the follow-up appointment.

## 2012-04-03 NOTE — Progress Notes (Signed)
Patient ID: [place], male   DOB: 09-07-1940, 71 y.o.   MRN: [id number] 71 yo with history of CAD s/p LAD PCI and diabetes mellitus presents for cardiology followup.  Patient had a NSTEMI [place] 2008 with tandem severe LAD stenoses that were treated with Xience drug eluting stents.  Since that time, he has done well.  Last myoview was [place] 3/11 and showed no evidence of ischemia or infarction per report.  Patient has had no recent chest pain.  He has no exertional dyspnea.  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  He has had some ankle swelling that he has noted since increasing amlodipine to 10 mg daily.  The swelling was worse Henry [place] the summer and has improved recently.  He spends part of the year [place] and part of the year [place] with family.   ECG: NSR, normal   Labs (12/10): LDL 54, HDL 54, creatinine 1.6 Labs (12/11): LDL 68, HDL 39, K 4.9, creatinine 1.6 Labs (6/12): LDL 65, HDL 39 Labs (7/12): K 4.6, creatinine 1.7  Allergies (verified):  No Known Drug Allergies  Past Medical History: 1. CAD: NSTEMI 2008 with tandem severe mid LAD stenoses treated with Xience V drug eluting stents.  Lexiscan myovew [place] 3/11 showed EF 58%, no ischemia or infarction.  2. Hyperlipidemia 3. Diabetes mellitus 4. Post-polio syndrome with leg weakness 5. HTN 6. CKD: Creatinine around 1.6 chronically.   Family History: No premature CAD  Social History: Lives [place] with family Retired from [employer] Nonsmoker  ROS: All systems reviewed and negative except as per HPI.   Current Outpatient Prescriptions  Medication Sig Dispense Refill  . amLODipine (NORVASC) 10 MG tablet Take 10 mg by mouth daily.      [name] Kitchen aspirin 81 MG tablet Take 81 mg by mouth daily.        . clopidogrel (PLAVIX) 75 MG tablet Take 75 mg by mouth daily.        . fish oil-omega-3 fatty acids 1000 MG capsule Take 2 capsules (2 g  total) by mouth daily.      [name] Kitchen glipiZIDE (GLUCOTROL) 5 MG tablet Take 5 mg by mouth daily.        . metoprolol succinate (TOPROL-XL) 50 MG 24 hr tablet Take 50 mg by mouth daily. Take with or immediately following a meal.      . saxagliptin HCl (ONGLYZA) 2.5 MG TABS tablet Take 2.5 mg by mouth daily.        . simvastatin (ZOCOR) 20 MG tablet TAKE ONE TABLET BY MOUTH EVERY DAY  30 tablet  6    BP 132/66  Pulse 63  Ht 5\' 5"  (1.651 m)  Wt 143 lb 12.8 oz (65.227 kg)  BMI 23.93 kg/m2 General: NAD Neck: No JVD, no thyromegaly or thyroid nodule.  Lungs: Clear to auscultation bilaterally with normal respiratory effort. CV: Nondisplaced PMI.  Heart regular S1/S2, no S3/S4, no murmur.  1+ ankle edema bilaterally.  No carotid bruit.  Normal pedal pulses.  Bilateral lower leg varicosities.  Abdomen: Soft, nontender, no hepatosplenomegaly, no distention.  Neurologic: Alert and oriented x 3.  Psych: Normal affect. Extremities: No clubbing or cyanosis.   Assessment/Plan:  CAD, NATIVE VESSEL Patient had NSTEMI with 2 drug eluting stents [place] the mid LAD Henry [place] 2008. His prior cardiologist recommended that he continue Plavix long-term (has been on it since 2008), so  I will continue it at this time. No ischemic symptoms. Normal Lexiscan myoview Henry [place] 3/11.  I will continue current meds: ASA, Plavix, statin, metoprolol.  No ACEI at this time due to CKD.  HYPERLIPIDEMIA-MIXED Check lipids, goal LDL < 70.  HYPERTENSION, UNSPECIFIED  BP well-controlled, continue current meds.  Edema Lower extremity edema since increasing amlodipine.  Worse Henry [place] the summer, better now.  He does not have elevated JVP or significant exertional dyspnea.  I suspect that the edema is a side effect of amlodipine.  - As edema is relatively mild, would continue amlodipine.  Keep legs elevated when seated and wear compression stockings.   - Check BNP - Echocardiogram to make sure that EF remains preserved.  CKD BMET today.  He is not on an  ACEI due to CKD.   Henry Turner 04/03/2012 2:38 PM

## 2012-04-09 ENCOUNTER — Other Ambulatory Visit (INDEPENDENT_AMBULATORY_CARE_PROVIDER_SITE_OTHER): Payer: Medicare Other

## 2012-04-09 ENCOUNTER — Ambulatory Visit (HOSPITAL_COMMUNITY): Payer: Medicare Other | Attending: Cardiology | Admitting: Radiology

## 2012-04-09 DIAGNOSIS — I059 Rheumatic mitral valve disease, unspecified: Secondary | ICD-10-CM | POA: Insufficient documentation

## 2012-04-09 DIAGNOSIS — I2510 Atherosclerotic heart disease of native coronary artery without angina pectoris: Secondary | ICD-10-CM

## 2012-04-09 DIAGNOSIS — N189 Chronic kidney disease, unspecified: Secondary | ICD-10-CM | POA: Insufficient documentation

## 2012-04-09 DIAGNOSIS — R609 Edema, unspecified: Secondary | ICD-10-CM | POA: Insufficient documentation

## 2012-04-09 DIAGNOSIS — I369 Nonrheumatic tricuspid valve disorder, unspecified: Secondary | ICD-10-CM | POA: Diagnosis not present

## 2012-04-09 DIAGNOSIS — I129 Hypertensive chronic kidney disease with stage 1 through stage 4 chronic kidney disease, or unspecified chronic kidney disease: Secondary | ICD-10-CM | POA: Diagnosis not present

## 2012-04-09 DIAGNOSIS — R0602 Shortness of breath: Secondary | ICD-10-CM

## 2012-04-09 DIAGNOSIS — E119 Type 2 diabetes mellitus without complications: Secondary | ICD-10-CM | POA: Insufficient documentation

## 2012-04-09 LAB — LIPID PANEL
Cholesterol: 121 mg/dL (ref 0–200)
HDL: 49.3 mg/dL (ref >39.00)
LDL Cholesterol: 60 mg/dL (ref 0–99)
Total CHOL/HDL Ratio: 2
Triglycerides: 59 mg/dL (ref 0.0–149.0)

## 2012-04-09 LAB — HEPATIC FUNCTION PANEL
ALT: 14 U/L (ref 0–53)
Bilirubin, Direct: 0.1 mg/dL (ref 0.0–0.3)
Total Bilirubin: 1 mg/dL (ref 0.3–1.2)
Total Protein: 7.9 g/dL (ref 6.0–8.3)

## 2012-04-09 LAB — BRAIN NATRIURETIC PEPTIDE: Pro B Natriuretic peptide (BNP): 44 pg/mL (ref 0.0–100.0)

## 2012-04-09 LAB — BASIC METABOLIC PANEL
BUN: 26 mg/dL — ABNORMAL HIGH (ref 6–23)
Calcium: 9.2 mg/dL (ref 8.4–10.5)
Chloride: 109 mEq/L (ref 96–112)
Creatinine, Ser: 1.8 mg/dL — ABNORMAL HIGH (ref 0.4–1.5)

## 2012-04-09 NOTE — Progress Notes (Signed)
Echocardiogram performed.  

## 2012-04-10 ENCOUNTER — Other Ambulatory Visit: Payer: Medicare Other

## 2012-04-22 ENCOUNTER — Encounter: Payer: Self-pay | Admitting: Internal Medicine

## 2012-04-22 ENCOUNTER — Ambulatory Visit (INDEPENDENT_AMBULATORY_CARE_PROVIDER_SITE_OTHER): Payer: Medicare Other | Admitting: Internal Medicine

## 2012-04-22 VITALS — BP 132/66 | HR 62 | Temp 97.7°F | Resp 16 | Ht 65.0 in | Wt 142.0 lb

## 2012-04-22 DIAGNOSIS — E119 Type 2 diabetes mellitus without complications: Secondary | ICD-10-CM

## 2012-04-22 DIAGNOSIS — N189 Chronic kidney disease, unspecified: Secondary | ICD-10-CM | POA: Diagnosis not present

## 2012-04-22 DIAGNOSIS — Z23 Encounter for immunization: Secondary | ICD-10-CM

## 2012-04-22 DIAGNOSIS — E1129 Type 2 diabetes mellitus with other diabetic kidney complication: Secondary | ICD-10-CM

## 2012-04-22 DIAGNOSIS — E1122 Type 2 diabetes mellitus with diabetic chronic kidney disease: Secondary | ICD-10-CM

## 2012-04-22 DIAGNOSIS — I10 Essential (primary) hypertension: Secondary | ICD-10-CM

## 2012-04-22 LAB — CBC
HCT: 35.1 % — ABNORMAL LOW (ref 39.0–52.0)
MCHC: 32.5 g/dL (ref 30.0–36.0)
RDW: 16.2 % — ABNORMAL HIGH (ref 11.5–15.5)

## 2012-04-22 LAB — HEMOGLOBIN A1C: Mean Plasma Glucose: 128 mg/dL — ABNORMAL HIGH (ref <117)

## 2012-04-22 NOTE — Patient Instructions (Signed)
Please schedule fasting labs prior to next visit Chem7, a1c-250.00 and lipid/lft-272.4 

## 2012-04-27 DIAGNOSIS — E1122 Type 2 diabetes mellitus with diabetic chronic kidney disease: Secondary | ICD-10-CM | POA: Insufficient documentation

## 2012-04-27 NOTE — Progress Notes (Signed)
  Subjective:    Patient ID: Henry [place], male    DOB: 02/17/1941, 71 y.o.   MRN: [id number]  HPI patient presents to clinic to establish primary care and followup of multiple medical problems. No history of CAD followed by cardiology. Tolerates statin therapy without myalgias or known abnormal L. teas. Does have history of chronic renal insufficiency with last creatinine reviewed to be 1.8. States has post polio syndrome with resulting right ankle difficulties status post surgery. Reports fsbs nl to low 100's without hypoglycemia. Needs eye exam but is leaving for [place] next week.   Past Medical History  Diagnosis Date  . CAD (coronary artery disease)     NSTEMI 2008 with tandem severe mid LAD stenoses treated with Xience V drug eluting stents. Lexiscan myovew Jonatha 3/11 showed EF 58%, no ischemia or infarction  . Hyperlipidemia   . Diabetes mellitus   . Post-polio syndrome     leg weakness  . HTN (hypertension)    Past Surgical History  Procedure Date  . Drug eluting stents     reports that he has quit smoking. His smoking use included Cigarettes. He does not have any smokeless tobacco history on file. His alcohol and drug histories not on file. family history is not on file. No Known Allergies   Review of Systems see hpi     Objective:   Physical Exam  Nursing note and vitals reviewed. Constitutional: He appears well-developed and well-nourished. No distress.  HENT:  Head: Normocephalic and atraumatic.  Right Ear: External ear normal.  Left Ear: External ear normal.  Eyes: Conjunctivae normal are normal. No scleral icterus.  Neck: Neck supple.  Cardiovascular: Normal rate, regular rhythm and normal heart sounds.  Exam reveals no gallop and no friction rub.   No murmur heard. Pulmonary/Chest: Effort normal and breath sounds normal. No respiratory distress. He has no wheezes. He has no rales.  Lymphadenopathy:    He has no cervical adenopathy.  Neurological: He is alert.  Skin:  Skin is warm and dry. He is not diaphoretic.  Psychiatric: He has a normal mood and affect.          Assessment & Plan:

## 2012-04-27 NOTE — Assessment & Plan Note (Signed)
Obtain cbc, a1c, and urine microalbumin. Pt to call when returns from [facility] for optho referral.

## 2012-04-27 NOTE — Assessment & Plan Note (Signed)
Instructed to avoid nsaids

## 2012-04-27 NOTE — Assessment & Plan Note (Signed)
Normotensive and stable. Continue current regimen. Monitor bp as outpt and followup [facility] as scheduled.  

## 2012-04-29 ENCOUNTER — Telehealth: Payer: Self-pay | Admitting: *Deleted

## 2012-04-29 DIAGNOSIS — D649 Anemia, unspecified: Secondary | ICD-10-CM

## 2012-04-29 NOTE — Telephone Encounter (Signed)
Patient informed, understood & agreed; future lab orders entered/SLS

## 2012-04-29 NOTE — Telephone Encounter (Signed)
Message copied by Regis Bill on Mon Apr 29, 2012  5:11 PM ------      Message from: Edwyna Perfect      Created: Sun Apr 28, 2012  2:06 PM       Sugar under good control. Is mildly anemic. Add cbc and ferritin to next labs dx anemia

## 2012-05-01 DIAGNOSIS — M21969 Unspecified acquired deformity of unspecified lower leg: Secondary | ICD-10-CM | POA: Diagnosis not present

## 2012-07-10 DIAGNOSIS — M21969 Unspecified acquired deformity of unspecified lower leg: Secondary | ICD-10-CM | POA: Diagnosis not present

## 2012-07-23 ENCOUNTER — Ambulatory Visit (INDEPENDENT_AMBULATORY_CARE_PROVIDER_SITE_OTHER): Payer: Medicare Other | Admitting: Family Medicine

## 2012-07-23 ENCOUNTER — Encounter: Payer: Self-pay | Admitting: Family Medicine

## 2012-07-23 VITALS — BP 132/76 | HR 70 | Temp 97.7°F | Ht 65.0 in | Wt 143.0 lb

## 2012-07-23 DIAGNOSIS — E785 Hyperlipidemia, unspecified: Secondary | ICD-10-CM | POA: Diagnosis not present

## 2012-07-23 DIAGNOSIS — E1129 Type 2 diabetes mellitus with other diabetic kidney complication: Secondary | ICD-10-CM | POA: Diagnosis not present

## 2012-07-23 DIAGNOSIS — N189 Chronic kidney disease, unspecified: Secondary | ICD-10-CM | POA: Diagnosis not present

## 2012-07-23 DIAGNOSIS — E1122 Type 2 diabetes mellitus with diabetic chronic kidney disease: Secondary | ICD-10-CM

## 2012-07-23 DIAGNOSIS — I10 Essential (primary) hypertension: Secondary | ICD-10-CM | POA: Diagnosis not present

## 2012-07-23 NOTE — Patient Instructions (Addendum)
Labs prior to visit, lipid, renal, cbc, tsh, hgba1c

## 2012-07-28 ENCOUNTER — Encounter: Payer: Self-pay | Admitting: Family Medicine

## 2012-07-28 NOTE — Assessment & Plan Note (Signed)
Tolerating Simvastatin, recheck labs prior to his next visit.

## 2012-07-28 NOTE — Progress Notes (Signed)
Patient ID: Henry Turner, male   DOB: 07-Nov-1940, 72 y.o.   MRN: [id number] Henry Turner [id number] Oct 21, 1940 07/28/2012      Progress Note-Follow Up  Subjective  Chief Complaint  Chief Complaint  Patient presents with  . Follow-up    HPI  Patient is a 72 year old [place] male who is Henry Turner today for followup and to have forms filled out for the [facility]. 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 He has not had any recent illness. Denies chest pain, palpitations, high low blood sugars. Denies GI or GU complaints at this time.  Past Medical History  Diagnosis Date  . CAD (coronary artery disease)     NSTEMI 2008 with tandem severe mid LAD stenoses treated with Xience V drug eluting stents. Lexiscan myovew Kiondre 3/11 showed EF 58%, no ischemia or infarction  . Hyperlipidemia   . Diabetes mellitus   . Post-polio syndrome     leg weakness  . HTN (hypertension)     Past Surgical History  Procedure Date  . Drug eluting stents     No family history on file.  History   Social History  . Marital Status: Single    Spouse Name: N/A    Number of Children: N/A  . Years of Education: N/A   Occupational History  . Not on file.   Social History Main Topics  . Smoking status: Former Smoker    Types: Cigarettes  . Smokeless tobacco: Not on file     Comment: >20 yrs  . Alcohol Use: Not on file  . Drug Use: Not on file  . Sexually Active: Not on file   Other Topics Concern  . Not on file   Social History Narrative   Lives Henry Turner with family. Retired from [employer]. No family hx of premature CAD.     Current Outpatient Prescriptions on File Prior to Visit  Medication Sig Dispense Refill  . amLODipine (NORVASC) 10 MG tablet Take 10 mg by mouth daily.      [name] Kitchen aspirin 81 MG tablet Take 81 mg by mouth daily.        . clopidogrel (PLAVIX) 75 MG tablet Take 75 mg by mouth daily.        . fish oil-omega-3 fatty acids 1000 MG capsule Take 2  capsules (2 g total) by mouth daily.      [name] Kitchen glipiZIDE (GLUCOTROL) 5 MG tablet Take 5 mg by mouth daily.        . metoprolol succinate (TOPROL-XL) 50 MG 24 hr tablet Take 50 mg by mouth daily. Take with or immediately following a meal.      . saxagliptin HCl (ONGLYZA) 2.5 MG TABS tablet Take 2.5 mg by mouth daily.        . simvastatin (ZOCOR) 20 MG tablet TAKE ONE TABLET BY MOUTH EVERY DAY  30 tablet  6    No Known Allergies  Review of Systems  Review of Systems  Constitutional: Negative for fever and malaise/fatigue.  HENT: Negative for congestion.   Eyes: Negative for discharge.  Respiratory: Negative for shortness of breath.   Cardiovascular: Negative for chest pain, palpitations and leg swelling.  Gastrointestinal: Negative for nausea, abdominal pain and diarrhea.  Genitourinary: Negative for dysuria.  Musculoskeletal: Negative for falls.  Skin: Negative for rash.  Neurological: Negative for loss of consciousness and headaches.  Endo/Heme/Allergies: Negative for polydipsia.  Psychiatric/Behavioral: Negative for depression and suicidal ideas. The  patient is not nervous/anxious and does not have insomnia.     Objective  BP 132/76  Pulse 70  Temp 97.7 F (36.5 C) (Oral)  Ht 5\' 5"  (1.651 m)  Wt 143 lb 0.6 oz (64.883 kg)  BMI 23.80 kg/m2  SpO2 97%  Physical Exam  Physical Exam  Constitutional: He is oriented to person, place, and time and well-developed, well-nourished, and Kalven no distress. No distress.  HENT:  Head: Normocephalic and atraumatic.  Eyes: Conjunctivae normal are normal.  Neck: Neck supple. No thyromegaly present.  Cardiovascular: Normal rate, regular rhythm and normal heart sounds.   No murmur heard. Pulmonary/Chest: Effort normal and breath sounds normal. No respiratory distress.  Abdominal: He exhibits no distension and no mass. There is no tenderness.  Musculoskeletal: He exhibits no edema.  Neurological: He is alert and oriented to person, place, and  time.  Skin: Skin is warm.  Psychiatric: Memory, affect and judgment normal.    No results found for this basename: TSH   Lab Results  Component Value Date   WBC 5.5 04/22/2012   HGB 11.4* 04/22/2012   HCT 35.1* 04/22/2012   MCV 86.5 04/22/2012   PLT 153 04/22/2012   Lab Results  Component Value Date   CREATININE 1.8* 04/09/2012   BUN 26* 04/09/2012   NA 142 04/09/2012   K 4.8 04/09/2012   CL 109 04/09/2012   CO2 25 04/09/2012   Lab Results  Component Value Date   ALT 14 04/09/2012   AST 20 04/09/2012   ALKPHOS 73 04/09/2012   BILITOT 1.0 04/09/2012   Lab Results  Component Value Date   CHOL 121 04/09/2012   Lab Results  Component Value Date   HDL 49.30 04/09/2012   Lab Results  Component Value Date   LDLCALC 60 04/09/2012   Lab Results  Component Value Date   TRIG 59.0 04/09/2012   Lab Results  Component Value Date   CHOLHDL 2 04/09/2012     Assessment & Plan  HYPERTENSION, UNSPECIFIED Adequately controlled no changes.   Diabetes mellitus with chronic kidney disease Well controlled via lab work and patient report. Filled out some DMV forms confirming no concerning hypoglycemic epidsodes.continue meds  HYPERLIPIDEMIA-MIXED Tolerating Simvastatin, recheck labs prior to his next visit.

## 2012-07-28 NOTE — Assessment & Plan Note (Signed)
Adequately controlled no changes.  

## 2012-07-28 NOTE — Assessment & Plan Note (Signed)
Well controlled via lab work and patient report. Filled out some DMV forms confirming no concerning hypoglycemic epidsodes.continue meds

## 2012-07-30 DIAGNOSIS — H5200 Hypermetropia, unspecified eye: Secondary | ICD-10-CM | POA: Diagnosis not present

## 2012-07-30 DIAGNOSIS — E11319 Type 2 diabetes mellitus with unspecified diabetic retinopathy without macular edema: Secondary | ICD-10-CM | POA: Diagnosis not present

## 2012-08-30 ENCOUNTER — Telehealth: Payer: Self-pay

## 2012-08-30 MED ORDER — SAXAGLIPTIN HCL 2.5 MG PO TABS: 2.5000 mg | ORAL_TABLET | Freq: Every day | ORAL | Status: DC

## 2012-08-30 MED ORDER — GLIPIZIDE 5 MG PO TABS: 5.0000 mg | ORAL_TABLET | Freq: Every day | ORAL | Status: DC

## 2012-08-30 MED ORDER — SIMVASTATIN 20 MG PO TABS: 20.0000 mg | ORAL_TABLET | Freq: Every day | ORAL | Status: DC

## 2012-08-30 MED ORDER — AMLODIPINE BESYLATE 10 MG PO TABS: 10.0000 mg | ORAL_TABLET | Freq: Every day | ORAL | Status: DC

## 2012-08-30 MED ORDER — METOPROLOL SUCCINATE ER 50 MG PO TB24: 50.0000 mg | ORAL_TABLET | Freq: Every day | ORAL | Status: DC

## 2012-08-30 MED ORDER — CLOPIDOGREL BISULFATE 75 MG PO TABS: 75.0000 mg | ORAL_TABLET | Freq: Every day | ORAL | Status: DC

## 2012-08-30 NOTE — Telephone Encounter (Signed)
Henry Turner with [facility] called stating pt was switching to them and they needed all of the pts RX's. I will send RX's

## 2012-09-09 ENCOUNTER — Other Ambulatory Visit: Payer: Self-pay

## 2012-09-09 DIAGNOSIS — D649 Anemia, unspecified: Secondary | ICD-10-CM

## 2012-09-10 LAB — CBC WITH DIFFERENTIAL/PLATELET
Basophils Relative: 1 % (ref 0–1)
Eosinophils Absolute: 0.1 10*3/uL (ref 0.0–0.7)
Eosinophils Relative: 2 % (ref 0–5)
Lymphs Abs: 1.1 10*3/uL (ref 0.7–4.0)
MCH: 30.7 pg (ref 26.0–34.0)
MCHC: 32.6 g/dL (ref 30.0–36.0)
MCV: 94.3 fL (ref 78.0–100.0)
Platelets: 127 10*3/uL — ABNORMAL LOW (ref 150–400)
RBC: 3.71 MIL/uL — ABNORMAL LOW (ref 4.22–5.81)

## 2012-09-17 ENCOUNTER — Ambulatory Visit: Payer: Medicare Other | Admitting: Family Medicine

## 2012-10-01 ENCOUNTER — Ambulatory Visit (INDEPENDENT_AMBULATORY_CARE_PROVIDER_SITE_OTHER): Payer: Medicare Other | Admitting: Family Medicine

## 2012-10-01 ENCOUNTER — Encounter: Payer: Self-pay | Admitting: Family Medicine

## 2012-10-01 ENCOUNTER — Other Ambulatory Visit: Payer: Self-pay | Admitting: Family Medicine

## 2012-10-01 VITALS — BP 130/70 | HR 64 | Temp 97.8°F | Ht 65.0 in | Wt 137.1 lb

## 2012-10-01 DIAGNOSIS — I10 Essential (primary) hypertension: Secondary | ICD-10-CM

## 2012-10-01 DIAGNOSIS — E119 Type 2 diabetes mellitus without complications: Secondary | ICD-10-CM | POA: Diagnosis not present

## 2012-10-01 DIAGNOSIS — N189 Chronic kidney disease, unspecified: Secondary | ICD-10-CM

## 2012-10-01 DIAGNOSIS — E1122 Type 2 diabetes mellitus with diabetic chronic kidney disease: Secondary | ICD-10-CM

## 2012-10-01 DIAGNOSIS — E785 Hyperlipidemia, unspecified: Secondary | ICD-10-CM

## 2012-10-01 DIAGNOSIS — I2510 Atherosclerotic heart disease of native coronary artery without angina pectoris: Secondary | ICD-10-CM | POA: Diagnosis not present

## 2012-10-01 DIAGNOSIS — E1129 Type 2 diabetes mellitus with other diabetic kidney complication: Secondary | ICD-10-CM | POA: Diagnosis not present

## 2012-10-01 DIAGNOSIS — D649 Anemia, unspecified: Secondary | ICD-10-CM | POA: Diagnosis not present

## 2012-10-01 LAB — BASIC METABOLIC PANEL
BUN: 29 mg/dL — ABNORMAL HIGH (ref 6–23)
Creat: 1.76 mg/dL — ABNORMAL HIGH (ref 0.50–1.35)

## 2012-10-01 LAB — HEPATIC FUNCTION PANEL
ALT: 15 U/L (ref 0–53)
AST: 23 U/L (ref 0–37)
Albumin: 4.5 g/dL (ref 3.5–5.2)
Total Bilirubin: 0.6 mg/dL (ref 0.3–1.2)

## 2012-10-01 LAB — HEMOGLOBIN A1C
Hgb A1c MFr Bld: 6.1 % — ABNORMAL HIGH (ref <5.7)
Mean Plasma Glucose: 128 mg/dL — ABNORMAL HIGH (ref <117)

## 2012-10-01 LAB — CBC
HCT: 36.6 % — ABNORMAL LOW (ref 39.0–52.0)
Hemoglobin: 12.3 g/dL — ABNORMAL LOW (ref 13.0–17.0)
MCH: 30 pg (ref 26.0–34.0)
MCHC: 33.6 g/dL (ref 30.0–36.0)
RDW: 14.2 % (ref 11.5–15.5)

## 2012-10-01 LAB — PHOSPHORUS: Phosphorus: 3.8 mg/dL (ref 2.3–4.6)

## 2012-10-01 MED ORDER — GLUCOSE BLOOD VI DISK: 1.0000 | DISK | Freq: Every day | Status: DC

## 2012-10-01 NOTE — Patient Instructions (Addendum)
Labs at or prior to next visit, lipid, renal, cbc, tsh, hepatic, hgba1c Check blood sugars dsaily and as needed

## 2012-10-02 ENCOUNTER — Encounter: Payer: Self-pay | Admitting: Family Medicine

## 2012-10-02 NOTE — Assessment & Plan Note (Signed)
Creatinine stable, sugars well controlled, minimize simple carbs

## 2012-10-02 NOTE — Assessment & Plan Note (Signed)
Stable, asymptomatic disease since 2008

## 2012-10-02 NOTE — Progress Notes (Signed)
Patient ID: Henry Turner, male   DOB: 23-Sep-1940, 72 y.o.   MRN: [id number] Henry Turner [id number] 1940-12-31 10/02/2012      Progress Note-Follow Up  Subjective  Chief Complaint  Chief Complaint  Patient presents with  . Follow-up    HPI  Patient is a 72 year old Asian male who is Henry Turner today for followup. He feels well. He reports his blood sugars which he checks daily range of 76 and 1.6. He denies polyuria or polydipsia. Tries to exercise regularly and eat a diabetic diet. No recent numbness, fevers, chest pain, palpitations, shortness of breath, syncope, GI or GU concerns noted today  Past Medical History  Diagnosis Date  . CAD (coronary artery disease)     NSTEMI 2008 with tandem severe mid LAD stenoses treated with Xience V drug eluting stents. Lexiscan myovew Henry Turner 3/11 showed EF 58%, no ischemia or infarction  . Hyperlipidemia   . Diabetes mellitus   . Post-polio syndrome     leg weakness  . HTN (hypertension)     Past Surgical History  Procedure Laterality Date  . Drug eluting stents      History reviewed. No pertinent family history.  History   Social History  . Marital Status: Single    Spouse Name: N/A    Number of Children: N/A  . Years of Education: N/A   Occupational History  . Not on file.   Social History Main Topics  . Smoking status: Former Smoker    Types: Cigarettes  . Smokeless tobacco: Not on file     Comment: >20 yrs  . Alcohol Use: Not on file  . Drug Use: Not on file  . Sexually Active: Not on file   Other Topics Concern  . Not on file   Social History Narrative   Lives Henry Turner with family. Retired from [employer]. No family hx of premature CAD.     Current Outpatient Prescriptions on File Prior to Visit  Medication Sig Dispense Refill  . amLODipine (NORVASC) 10 MG tablet Take 1 tablet (10 mg total) by mouth daily.  30 tablet  5  . aspirin 81 MG tablet Take 81 mg by mouth daily.        . clopidogrel (PLAVIX) 75 MG  tablet Take 1 tablet (75 mg total) by mouth daily.  30 tablet  5  . fish oil-omega-3 fatty acids 1000 MG capsule Take 2 capsules (2 g total) by mouth daily.      [name] Kitchen glipiZIDE (GLUCOTROL) 5 MG tablet Take 1 tablet (5 mg total) by mouth daily.  30 tablet  5  . metoprolol succinate (TOPROL-XL) 50 MG 24 hr tablet Take 1 tablet (50 mg total) by mouth daily. Take with or immediately following a meal.  30 tablet  5  . saxagliptin HCl (ONGLYZA) 2.5 MG TABS tablet Take 1 tablet (2.5 mg total) by mouth daily.  30 tablet  5  . simvastatin (ZOCOR) 20 MG tablet Take 1 tablet (20 mg total) by mouth daily.  30 tablet  5   No current facility-administered medications on file prior to visit.    No Known Allergies  Review of Systems  Review of Systems  Constitutional: Negative for fever and malaise/fatigue.  HENT: Negative for congestion.   Eyes: Negative for discharge.  Respiratory: Negative for shortness of breath.   Cardiovascular: Negative for chest pain, palpitations and leg swelling.  Gastrointestinal: Negative for nausea, abdominal pain and diarrhea.  Genitourinary: Negative for dysuria.  Musculoskeletal:  Negative for falls.  Skin: Negative for rash.  Neurological: Negative for loss of consciousness and headaches.  Endo/Heme/Allergies: Negative for polydipsia.  Psychiatric/Behavioral: Negative for depression and suicidal ideas. The patient is not nervous/anxious and does not have insomnia.     Objective  BP 130/70  Pulse 64  Temp(Src) 97.8 F (36.6 C) (Oral)  Ht 5\' 5"  (1.651 m)  Wt 137 lb 1.9 oz (62.197 kg)  BMI 22.82 kg/m2  SpO2 98%  Physical Exam  Physical Exam  Constitutional: He is oriented to person, place, and time and well-developed, well-nourished, and Henry Turner no distress. No distress.  HENT:  Head: Normocephalic and atraumatic.  Eyes: Conjunctivae are normal.  Neck: Neck supple. No thyromegaly present.  Cardiovascular: Normal rate, regular rhythm and normal heart sounds.   No  murmur heard. Pulmonary/Chest: Effort normal and breath sounds normal. No respiratory distress.  Abdominal: He exhibits no distension and no mass. There is no tenderness.  Musculoskeletal: He exhibits no edema.  Neurological: He is alert and oriented to person, place, and time.  Skin: Skin is warm.  Psychiatric: Memory, affect and judgment normal.    No results found for this basename: TSH   Lab Results  Component Value Date   WBC 5.4 10/01/2012   HGB 12.3* 10/01/2012   HCT 36.6* 10/01/2012   MCV 89.3 10/01/2012   PLT 135* 10/01/2012   Lab Results  Component Value Date   CREATININE 1.76* 10/01/2012   BUN 29* 10/01/2012   NA 141 10/01/2012   K 5.1 10/01/2012   CL 108 10/01/2012   CO2 25 10/01/2012   Lab Results  Component Value Date   ALT 15 10/01/2012   AST 23 10/01/2012   ALKPHOS 80 10/01/2012   BILITOT 0.6 10/01/2012   Lab Results  Component Value Date   CHOL 121 04/09/2012   Lab Results  Component Value Date   HDL 49.30 04/09/2012   Lab Results  Component Value Date   LDLCALC 60 04/09/2012   Lab Results  Component Value Date   TRIG 59.0 04/09/2012   Lab Results  Component Value Date   CHOLHDL 2 04/09/2012     Assessment & Plan  Diabetes mellitus with chronic kidney disease Creatinine stable, sugars well controlled, minimize simple carbs  HYPERLIPIDEMIA-MIXED Tolerating Zocor, lipids, well controlled  HYPERTENSION, UNSPECIFIED Well controlled, no changes  CAD, NATIVE VESSEL Stable, asymptomatic disease since 2008

## 2012-10-02 NOTE — Assessment & Plan Note (Signed)
Well controlled, no changes 

## 2012-10-02 NOTE — Assessment & Plan Note (Signed)
Tolerating Zocor, lipids, well controlled

## 2012-10-03 NOTE — Progress Notes (Signed)
Quick Note:  Patient Informed and voiced understanding ______ 

## 2012-10-08 ENCOUNTER — Encounter: Payer: Self-pay | Admitting: Cardiology

## 2012-10-08 ENCOUNTER — Ambulatory Visit (INDEPENDENT_AMBULATORY_CARE_PROVIDER_SITE_OTHER): Payer: Medicare Other | Admitting: Cardiology

## 2012-10-08 VITALS — BP 109/60 | HR 65 | Ht 65.0 in | Wt 139.1 lb

## 2012-10-08 DIAGNOSIS — I129 Hypertensive chronic kidney disease with stage 1 through stage 4 chronic kidney disease, or unspecified chronic kidney disease: Secondary | ICD-10-CM | POA: Diagnosis not present

## 2012-10-08 DIAGNOSIS — N189 Chronic kidney disease, unspecified: Secondary | ICD-10-CM

## 2012-10-08 DIAGNOSIS — I10 Essential (primary) hypertension: Secondary | ICD-10-CM

## 2012-10-08 DIAGNOSIS — E785 Hyperlipidemia, unspecified: Secondary | ICD-10-CM

## 2012-10-08 DIAGNOSIS — R609 Edema, unspecified: Secondary | ICD-10-CM

## 2012-10-08 DIAGNOSIS — I2510 Atherosclerotic heart disease of native coronary artery without angina pectoris: Secondary | ICD-10-CM

## 2012-10-08 LAB — LIPID PANEL: HDL: 37.8 mg/dL — ABNORMAL LOW (ref >39.00)

## 2012-10-08 NOTE — Patient Instructions (Addendum)
Your physician recommends that you have  lab work today--Lipid profile.  Your physician wants you to follow-up Henry Turner: 1 year with Henry Turner. (April 2015). You will receive a reminder letter Henry Turner the mail two months Henry Turner advance. If you don't receive a letter, please call our office to schedule the follow-up appointment.

## 2012-10-08 NOTE — Progress Notes (Signed)
Patient ID: Henry Turner, male   DOB: Nov 07, 1940, 72 y.o.   MRN: [id number] PCP: Dr. Abner Greenspan  72 yo with history of CAD s/p LAD PCI and diabetes mellitus presents for cardiology followup.  Patient had a NSTEMI Henry Turner 2008 with tandem severe LAD stenoses that were treated with Xience drug eluting stents.  Since that time, he has done well.  Last myoview was Henry Turner Turner and showed no evidence of ischemia or infarction per report.  Patient has had no recent chest pain.  He has no exertional dyspnea.  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  He has mild stable ankle edema.  He does get some exercise by walking or riding a stationary bike.  He spends part of the year Henry Turner [place] and part of the year Henry Turner [place] with family.   He does report some Raynaud's type symptoms Henry Turner his fingertips when it gets cold.   ECG: NSR, normal   Labs (12/10): LDL 54, HDL 54, creatinine 1.6 Labs (12/11): LDL 68, HDL 39, K 4.9, creatinine 1.6 Labs (6/12): LDL 65, HDL 39 Labs (7/12): K 4.6, creatinine 1.7 Labs (10/13): LDL 60, HDL 49 Labs (4/14): K 5.1, creatinine 1.76  Allergies (verified):  No Known Drug Allergies  Past Medical History: 1. CAD: NSTEMI 2008 with tandem severe mid LAD stenoses treated with Xience V drug eluting stents.  Lexiscan myovew Henry Turner showed EF 58%, no ischemia or infarction.  2. Hyperlipidemia 3. Diabetes mellitus 4. Post-polio syndrome with leg weakness 5. HTN 6. CKD: Creatinine around 1.6 chronically.   Family History: No premature CAD  Social History: Lives Henry Turner [place] with family Retired from [employer]   Current Outpatient Prescriptions  Medication Sig Dispense Refill  . amLODipine (NORVASC) 10 MG tablet Take 1 tablet (10 mg total) by mouth daily.  30 tablet  5  . aspirin 81 MG tablet Take 81 mg by mouth daily.        . clopidogrel (PLAVIX) 75 MG tablet Take 1 tablet (75 mg total) by mouth daily.  30 tablet  5  . fish oil-omega-3 fatty acids 1000  MG capsule Take 2 capsules (2 g total) by mouth daily.      Henry Turner Kitchen glipiZIDE (GLUCOTROL) 5 MG tablet Take 1 tablet (5 mg total) by mouth daily.  30 tablet  5  . Glucose Blood (BAYER BREEZE 2 TEST) DISK 1 each by Henry Turner route daily. Check every day and prn. DX 250.40  100 each  1  . metoprolol succinate (TOPROL-XL) 50 MG 24 hr tablet Take 1 tablet (50 mg total) by mouth daily. Take with or immediately following a meal.  30 tablet  5  . saxagliptin HCl (ONGLYZA) 2.5 MG TABS tablet Take 1 tablet (2.5 mg total) by mouth daily.  30 tablet  5  . simvastatin (ZOCOR) 20 MG tablet Take 1 tablet (20 mg total) by mouth daily.  30 tablet  5   No current facility-administered medications for this visit.    BP 109/60  Pulse 65  Ht 5\' 5"  (1.651 m)  Wt 139 lb 1.9 oz (63.104 kg)  BMI 23.15 kg/m2  SpO2 99% General: NAD Neck: No JVD, no thyromegaly or thyroid nodule.  Lungs: Clear to auscultation bilaterally with normal respiratory effort. CV: Nondisplaced PMI.  Heart regular S1/S2, no S3/S4, no murmur.  1+ ankle edema bilaterally.  No carotid bruit.  Normal pedal pulses.  Bilateral lower leg varicosities.  Abdomen: Soft, nontender, no hepatosplenomegaly, no distention.  Neurologic: Alert and oriented x 3.  Psych: Normal affect. Extremities: No clubbing or cyanosis.   Assessment/Plan:  CAD, NATIVE VESSEL Patient had NSTEMI with 2 drug eluting stents Henry Turner the mid LAD Henry Turner 2008. His prior cardiologist recommended that he continue Plavix long-term (has been on it since 2008), so I will continue it at this time. No ischemic symptoms. Normal Lexiscan myoview Henry Turner Turner.  I will continue current meds: ASA, Plavix, statin, metoprolol.  No ACEI at this time due to CKD.  HYPERLIPIDEMIA-MIXED Check lipids today.  HYPERTENSION, UNSPECIFIED  BP well-controlled, continue current meds.  Edema Stable lower extremity edema.  May be related to amlodipine.  CKD Creatinine has been stable.  Raynaud's-type symptoms Patient's  fingertips get "ice cold" when it is cold outside.  Could be related to beta blocker use.  It is manageable for him, will not change medications.   Marca Ancona 10/08/2012 3:28 PM

## 2013-01-15 DIAGNOSIS — M21969 Unspecified acquired deformity of unspecified lower leg: Secondary | ICD-10-CM | POA: Diagnosis not present

## 2013-01-21 DIAGNOSIS — M899 Disorder of bone, unspecified: Secondary | ICD-10-CM | POA: Diagnosis not present

## 2013-01-21 DIAGNOSIS — Z981 Arthrodesis status: Secondary | ICD-10-CM | POA: Diagnosis not present

## 2013-01-21 DIAGNOSIS — S96819A Strain of other specified muscles and tendons at ankle and foot level, unspecified foot, initial encounter: Secondary | ICD-10-CM | POA: Diagnosis not present

## 2013-01-21 DIAGNOSIS — M949 Disorder of cartilage, unspecified: Secondary | ICD-10-CM | POA: Diagnosis not present

## 2013-01-21 DIAGNOSIS — S93499A Sprain of other ligament of unspecified ankle, initial encounter: Secondary | ICD-10-CM | POA: Diagnosis not present

## 2013-03-07 ENCOUNTER — Other Ambulatory Visit: Payer: Self-pay | Admitting: *Deleted

## 2013-03-07 MED ORDER — AMLODIPINE BESYLATE 10 MG PO TABS: 10.0000 mg | ORAL_TABLET | Freq: Every day | ORAL | Status: DC

## 2013-03-07 MED ORDER — GLIPIZIDE 5 MG PO TABS: 5.0000 mg | ORAL_TABLET | Freq: Every day | ORAL | Status: DC

## 2013-03-07 MED ORDER — SAXAGLIPTIN HCL 2.5 MG PO TABS: 2.5000 mg | ORAL_TABLET | Freq: Every day | ORAL | Status: DC

## 2013-03-07 MED ORDER — CLOPIDOGREL BISULFATE 75 MG PO TABS: 75.0000 mg | ORAL_TABLET | Freq: Every day | ORAL | Status: DC

## 2013-03-07 MED ORDER — SIMVASTATIN 20 MG PO TABS: 20.0000 mg | ORAL_TABLET | Freq: Every day | ORAL | Status: DC

## 2013-03-07 NOTE — Telephone Encounter (Signed)
Rx request to pharmacy/[facility]  

## 2013-03-31 ENCOUNTER — Telehealth: Payer: Self-pay

## 2013-03-31 DIAGNOSIS — I10 Essential (primary) hypertension: Secondary | ICD-10-CM | POA: Diagnosis not present

## 2013-03-31 DIAGNOSIS — E785 Hyperlipidemia, unspecified: Secondary | ICD-10-CM | POA: Diagnosis not present

## 2013-03-31 DIAGNOSIS — N189 Chronic kidney disease, unspecified: Secondary | ICD-10-CM | POA: Diagnosis not present

## 2013-03-31 DIAGNOSIS — E1129 Type 2 diabetes mellitus with other diabetic kidney complication: Secondary | ICD-10-CM | POA: Diagnosis not present

## 2013-03-31 DIAGNOSIS — E1122 Type 2 diabetes mellitus with diabetic chronic kidney disease: Secondary | ICD-10-CM

## 2013-03-31 NOTE — Telephone Encounter (Signed)
Lab order placed.

## 2013-04-01 LAB — RENAL FUNCTION PANEL
Albumin: 3.9 g/dL (ref 3.5–5.2)
BUN: 26 mg/dL — ABNORMAL HIGH (ref 6–23)
Calcium: 9.1 mg/dL (ref 8.4–10.5)
Creat: 1.65 mg/dL — ABNORMAL HIGH (ref 0.50–1.35)
Glucose, Bld: 91 mg/dL (ref 70–99)
Phosphorus: 3 mg/dL (ref 2.3–4.6)

## 2013-04-01 LAB — LIPID PANEL
Cholesterol: 116 mg/dL (ref 0–200)
HDL: 55 mg/dL (ref >39)
Total CHOL/HDL Ratio: 2.1 Ratio
Triglycerides: 65 mg/dL (ref <150)
VLDL: 13 mg/dL (ref 0–40)

## 2013-04-01 LAB — HEPATIC FUNCTION PANEL
ALT: 24 U/L (ref 0–53)
AST: 23 U/L (ref 0–37)
Albumin: 3.9 g/dL (ref 3.5–5.2)
Alkaline Phosphatase: 62 U/L (ref 39–117)
Bilirubin, Direct: 0.2 mg/dL (ref 0.0–0.3)

## 2013-04-01 LAB — CBC
MCH: 30.2 pg (ref 26.0–34.0)
MCHC: 32.9 g/dL (ref 30.0–36.0)
RDW: 14.3 % (ref 11.5–15.5)

## 2013-04-01 LAB — HEMOGLOBIN A1C
Hgb A1c MFr Bld: 5.9 % — ABNORMAL HIGH (ref <5.7)
Mean Plasma Glucose: 123 mg/dL — ABNORMAL HIGH (ref <117)

## 2013-04-02 LAB — TSH: TSH: 1.297 u[IU]/mL (ref 0.350–4.500)

## 2013-04-07 ENCOUNTER — Encounter: Payer: Self-pay | Admitting: Family Medicine

## 2013-04-07 ENCOUNTER — Ambulatory Visit (INDEPENDENT_AMBULATORY_CARE_PROVIDER_SITE_OTHER): Payer: Medicare Other | Admitting: Family Medicine

## 2013-04-07 VITALS — BP 138/70 | HR 78 | Temp 97.9°F | Resp 16 | Ht 65.0 in | Wt 137.0 lb

## 2013-04-07 DIAGNOSIS — I2510 Atherosclerotic heart disease of native coronary artery without angina pectoris: Secondary | ICD-10-CM | POA: Diagnosis not present

## 2013-04-07 DIAGNOSIS — E1122 Type 2 diabetes mellitus with diabetic chronic kidney disease: Secondary | ICD-10-CM

## 2013-04-07 DIAGNOSIS — I10 Essential (primary) hypertension: Secondary | ICD-10-CM | POA: Diagnosis not present

## 2013-04-07 DIAGNOSIS — N189 Chronic kidney disease, unspecified: Secondary | ICD-10-CM | POA: Diagnosis not present

## 2013-04-07 DIAGNOSIS — D649 Anemia, unspecified: Secondary | ICD-10-CM

## 2013-04-07 DIAGNOSIS — E1129 Type 2 diabetes mellitus with other diabetic kidney complication: Secondary | ICD-10-CM

## 2013-04-07 DIAGNOSIS — Z23 Encounter for immunization: Secondary | ICD-10-CM | POA: Diagnosis not present

## 2013-04-07 DIAGNOSIS — E785 Hyperlipidemia, unspecified: Secondary | ICD-10-CM

## 2013-04-07 NOTE — Patient Instructions (Addendum)
Anemia, Frequently Asked Questions  WHAT ARE THE SYMPTOMS OF ANEMIA?   Headache.   Difficulty thinking.   Fatigue.   Shortness of breath.   Weakness.   Rapid heartbeat.  AT WHAT POINT ARE PEOPLE CONSIDERED ANEMIC?   This varies with gender and age.    Both hemoglobin (Hgb) and hematocrit values are used to define anemia. These lab values are obtained from a complete blood count (CBC) test. This is performed at a caregiver's office.   The normal range of hemoglobin values for adult men is 14.0 g/dL to 17.4 g/dL. For nonpregnant women, values are 12.3 g/dL to 15.3 g/dL.   The World Health Organization defines anemia as less than 12 g/dL for nonpregnant women and less than 13 g/dL for men.   For adult males, the average normal hematocrit is 46%, and the range is 40% to 52%.   For adult females, the average normal hematocrit is 41%, and the range is 35% to 47%.   Values that fall below the lower limits can be a sign of anemia and should have further checking (evaluation).  GROUPS OF PEOPLE WHO ARE AT RISK FOR DEVELOPING ANEMIA INCLUDE:    Infants who are breastfed or taking a formula that is not fortified with iron.   Children going through a rapid growth spurt. The iron available can not keep up with the needs for a red cell mass which must grow with the child.   Women Ulises childbearing years. They need iron because of blood loss during menstruation.   Pregnant women. The growing fetus creates a high demand for iron.   People with ongoing gastrointestinal blood loss are at risk of developing iron deficiency.   Individuals with leukemia or cancer who must receive chemotherapy or radiation to treat their disease. The drugs or radiation used to treat these diseases often decreases the bone marrow's ability to make cells of all classes. This includes red blood cells, white blood cells, and platelets.   Individuals with chronic inflammatory conditions such as rheumatoid arthritis or chronic  infections.   The elderly.  ARE SOME TYPES OF ANEMIA INHERITED?    Yes, some types of anemia are due to inherited or genetic defects.   Sickle cell anemia. This occurs most often Mostyn people of African, African American, and Mediterranean descent.   Thalassemia (or Cooley's anemia). This type is found Garnet people of Mediterranean and Southeast Asian descent. These types of anemia are common.   Fanconi. This is rare.  CAN CERTAIN MEDICATIONS CAUSE A PERSON TO BECOME ANEMIC?   Yes. For example, drugs to fight cancer (chemotherapeutic agents) often cause anemia. These drugs can slow the bone marrow's ability to make red blood cells. If there are not enough red blood cells, the body does not get enough oxygen.  WHAT HEMATOCRIT LEVEL IS REQUIRED TO DONATE BLOOD?   The lower limit of an acceptable hematocrit for blood donors is 38%. If you have a low hematocrit value, you should schedule an appointment with your caregiver.  ARE BLOOD TRANSFUSIONS COMMONLY USED TO CORRECT ANEMIA, AND ARE THEY DANGEROUS?   They are used to treat anemia as a last resort. Your caregiver will find the cause of the anemia and correct it if possible. Most blood transfusions are given because of excessive bleeding at the time of surgery, with trauma, or because of bone marrow suppression Kaylub patients with cancer or leukemia on chemotherapy. Blood transfusions are safer than ever before. We also know that blood transfusions   affect the immune system and may increase certain risks. There is also a concern for human error. Zaion 1/16,000 transfusions, a patient receives a transfusion of blood that is not matched with his or her blood type.   WHAT IS IRON DEFICIENCY ANEMIA AND CAN I CORRECT IT BY CHANGING MY DIET?   Iron is an essential part of hemoglobin. Without enough hemoglobin, anemia develops and the body does not get the right amount of oxygen. Iron deficiency anemia develops after the body has had a low level of iron for a long time. This is  either caused by blood loss, not taking Vinay or absorbing enough iron, or increased demands for iron (like pregnancy or rapid growth).   Foods from animal origin such as beef, chicken, and pork, are good sources of iron. Be sure to have one of these foods at each meal. Vitamin C helps your body absorb iron. Foods rich Abdelaziz Vitamin C include citrus, bell pepper, strawberries, spinach and cantaloupe. Tyrice some cases, iron supplements may be needed Geofrey order to correct the iron deficiency. Malaky the case of poor absorption, extra iron may have to be given directly into the vein through a needle (intravenously).  I HAVE BEEN DIAGNOSED WITH IRON DEFICIENCY ANEMIA AND MY CAREGIVER PRESCRIBED IRON SUPPLEMENTS. HOW LONG WILL IT TAKE FOR MY BLOOD TO BECOME NORMAL?   It depends on the degree of anemia at the beginning of treatment. Most people with mild to moderate iron deficiency, anemia will correct the anemia over a period of 2 to 3 months. But after the anemia is corrected, the iron stored by the body is still low. Caregivers often suggest an additional 6 months of oral iron therapy once the anemia has been reversed. This will help prevent the iron deficiency anemia from quickly happening again. Non-anemic adult males should take iron supplements only under the direction of a doctor, too much iron can cause liver damage.   MY HEMOGLOBIN IS 9 G/DL AND I AM SCHEDULED FOR SURGERY. SHOULD I POSTPONE THE SURGERY?   If you have Hgb of 9, you should discuss this with your caregiver right away. Many patients with similar hemoglobin levels have had surgery without problems. If minimal blood loss is expected for a minor procedure, no treatment may be necessary.   If a greater blood loss is expected for more extensive procedures, you should ask your caregiver about being treated with erythropoietin and iron. This is to accelerate the recovery of your hemoglobin to a normal level before surgery. An anemic patient who undergoes high-blood-loss  surgery has a greater risk of surgical complications and need for a blood transfusion, which also carries some risk.   I HAVE BEEN TOLD THAT HEAVY MENSTRUAL PERIODS CAUSE ANEMIA. IS THERE ANYTHING I CAN DO TO PREVENT THE ANEMIA?   Anemia that results from heavy periods is usually due to iron deficiency. You can try to meet the increased demands for iron caused by the heavy monthly blood loss by increasing the intake of iron-rich foods. Iron supplements may be required. Discuss your concerns with your caregiver.  WHAT CAUSES ANEMIA DURING PREGNANCY?   Pregnancy places major demands on the body. The mother must meet the needs of both her body and her growing baby. The body needs enough iron and folate to make the right amount of red blood cells. To prevent anemia while pregnant, the mother should stay Cuyler close contact with her caregiver.   Be sure to eat a diet that has foods   rich Durrel iron and folate like liver and dark green leafy vegetables. Folate plays an important role Kraig the normal development of a baby's spinal cord. Folate can help prevent serious disorders like spina bifida. If your diet does not provide adequate nutrients, you may want to talk with your caregiver about nutritional supplements.   WHAT IS THE RELATIONSHIP BETWEEN FIBROID TUMORS AND ANEMIA Zaion WOMEN?   The relationship is usually caused by the increased menstrual blood loss caused by fibroids. Good iron intake may be required to prevent iron deficiency anemia from developing.   Document Released: 01/26/2004 Document Revised: 09/11/2011 Document Reviewed: 07/12/2010  [employer] Patient Information 2014 [employer]

## 2013-04-07 NOTE — Progress Notes (Signed)
Patient ID: Henry Turner, male   DOB: 06/09/1941, 72 y.o.   MRN: [id number] Henry Turner [id number] [birth year] 04/07/2013      Progress Note-Follow Up  Subjective  Chief Complaint  Chief Complaint  Patient presents with  . Diabetes    Pt here for follow up.  . Hypertension    Pt here for follow up.  . Anemia    Pt here for follow up.    HPI  Patient is a 72 year old male who is [place] today for followup. He reports feeling well. He denies any recent illness. He's not had any fevers, headaches, chest pain or palpitations, shortness of breath, GI or GU complaints. Did stop his metoprolol secondary to some low blood pressures and a sense of lightheadedness.. Once he did that his lightheadedness resolved. Otherwise taking medications as prescribed eating well and exercising regularly  Past Medical History  Diagnosis Date  . CAD (coronary artery disease)     NSTEMI 2008 with tandem severe mid LAD stenoses treated with Xience V drug eluting stents. Lexiscan myovew [place] 3/11 showed EF 58%, no ischemia or infarction  . Hyperlipidemia   . Diabetes mellitus   . Post-polio syndrome     leg weakness  . HTN (hypertension)     Past Surgical History  Procedure Laterality Date  . Drug eluting stents      History reviewed. No pertinent family history.  History   Social History  . Marital Status: Single    Spouse Name: N/A    Number of Children: N/A  . Years of Education: N/A   Occupational History  . Not on file.   Social History Main Topics  . Smoking status: Former Smoker    Types: Cigarettes  . Smokeless tobacco: Not on file     Comment: >20 yrs  . Alcohol Use: Not on file  . Drug Use: Not on file  . Sexual Activity: Not on file   Other Topics Concern  . Not on file   Social History Narrative   Lives [place] with family. Retired from [employer]. No family hx of premature CAD.     Current Outpatient Prescriptions on File Prior to Visit  Medication Sig  Dispense Refill  . aspirin 81 MG tablet Take 81 mg by mouth daily.        . clopidogrel (PLAVIX) 75 MG tablet Take 1 tablet (75 mg total) by mouth daily.  30 tablet  2  . fish oil-omega-3 fatty acids 1000 MG capsule Take 2 capsules (2 g total) by mouth daily.      [name] Kitchen glipiZIDE (GLUCOTROL) 5 MG tablet Take 1 tablet (5 mg total) by mouth daily.  30 tablet  2  . Glucose Blood (BAYER BREEZE 2 TEST) DISK 1 each by Maurie Vitro route daily. Check every day and prn. DX 250.40  100 each  1  . metoprolol succinate (TOPROL-XL) 50 MG 24 hr tablet Take 1 tablet (50 mg total) by mouth daily. Take with or immediately following a meal.  30 tablet  5  . saxagliptin HCl (ONGLYZA) 2.5 MG TABS tablet Take 1 tablet (2.5 mg total) by mouth daily.  30 tablet  2  . simvastatin (ZOCOR) 20 MG tablet Take 1 tablet (20 mg total) by mouth daily.  30 tablet  2   No current facility-administered medications on file prior to visit.    No Known Allergies  Review of Systems  Review of Systems  Constitutional: Negative for fever and  malaise/fatigue.  HENT: Negative for congestion.   Eyes: Negative for discharge.  Respiratory: Negative for shortness of breath.   Cardiovascular: Negative for chest pain, palpitations and leg swelling.  Gastrointestinal: Negative for nausea, abdominal pain and diarrhea.  Genitourinary: Negative for dysuria.  Musculoskeletal: Negative for falls.  Skin: Negative for rash.  Neurological: Negative for loss of consciousness and headaches.  Endo/Heme/Allergies: Negative for polydipsia.  Psychiatric/Behavioral: Negative for depression and suicidal ideas. The patient is not nervous/anxious and does not have insomnia.     Objective  BP 138/70  Pulse 78  Temp(Src) 97.9 F (36.6 C) (Oral)  Resp 16  Ht 5\' 5"  (1.651 m)  Wt 137 lb 0.6 oz (62.161 kg)  BMI 22.8 kg/m2  SpO2 99%  Physical Exam  Physical Exam  Constitutional: He is oriented to person, place, and time and well-developed,  well-nourished, and Jadrian no distress. No distress.  HENT:  Head: Normocephalic and atraumatic.  Eyes: Conjunctivae are normal.  Neck: Neck supple. No thyromegaly present.  Cardiovascular: Normal rate, regular rhythm and normal heart sounds.   No murmur heard. Pulmonary/Chest: Effort normal and breath sounds normal. No respiratory distress.  Abdominal: He exhibits no distension and no mass. There is no tenderness.  Musculoskeletal: He exhibits no edema.  Neurological: He is alert and oriented to person, place, and time.  Skin: Skin is warm.  Psychiatric: Memory, affect and judgment normal.    Lab Results  Component Value Date   TSH 1.297 03/31/2013   Lab Results  Component Value Date   WBC 4.3 03/31/2013   HGB 11.4* 03/31/2013   HCT 34.6* 03/31/2013   MCV 91.5 03/31/2013   PLT 141* 03/31/2013   Lab Results  Component Value Date   CREATININE 1.65* 03/31/2013   BUN 26* 03/31/2013   NA 143 03/31/2013   K 4.8 03/31/2013   CL 109 03/31/2013   CO2 25 03/31/2013   Lab Results  Component Value Date   ALT 24 03/31/2013   AST 23 03/31/2013   ALKPHOS 62 03/31/2013   BILITOT 0.6 03/31/2013   Lab Results  Component Value Date   CHOL 116 03/31/2013   Lab Results  Component Value Date   HDL 55 03/31/2013   Lab Results  Component Value Date   LDLCALC 48 03/31/2013   Lab Results  Component Value Date   TRIG 65 03/31/2013   Lab Results  Component Value Date   CHOLHDL 2.1 03/31/2013     Assessment & Plan  HYPERTENSION, UNSPECIFIED Well controlled, but off Metoprolol, will restart Metoprolol and stop Amlodipine  CKD (chronic kidney disease) Creatinine improved slightly with this blood draw. No changes.   HYPERLIPIDEMIA-MIXED Tolerating Zocor, well controlled, no changes.  Diabetes mellitus with chronic kidney disease hgba1c 5.9 avoid simple carbs, continue current meds.   CAD, NATIVE VESSEL No recent events  Anemia Mild, will check Ifob, encouraged incresed leafy greens, will  check further labs with next blood draw

## 2013-04-11 ENCOUNTER — Ambulatory Visit: Payer: Medicare Other

## 2013-04-12 ENCOUNTER — Encounter: Payer: Self-pay | Admitting: Family Medicine

## 2013-04-12 DIAGNOSIS — D649 Anemia, unspecified: Secondary | ICD-10-CM

## 2013-04-12 HISTORY — DX: Anemia, unspecified: D64.9

## 2013-04-12 NOTE — Assessment & Plan Note (Signed)
Mild, will check Ifob, encouraged incresed leafy greens, will check further labs with next blood draw

## 2013-04-12 NOTE — Assessment & Plan Note (Addendum)
Well controlled, but off Metoprolol, will restart Metoprolol and stop Amlodipine

## 2013-04-12 NOTE — Assessment & Plan Note (Signed)
hgba1c 5.9 avoid simple carbs, continue current meds.

## 2013-04-12 NOTE — Assessment & Plan Note (Signed)
No recent events.

## 2013-04-12 NOTE — Assessment & Plan Note (Signed)
Tolerating Zocor, well controlled, no changes.

## 2013-04-12 NOTE — Assessment & Plan Note (Signed)
Creatinine improved slightly with this blood draw. No changes.

## 2013-04-15 LAB — FECAL OCCULT BLOOD, IMMUNOCHEMICAL: Fecal Occult Bld: NEGATIVE

## 2013-05-08 ENCOUNTER — Telehealth: Payer: Self-pay | Admitting: *Deleted

## 2013-05-08 NOTE — Telephone Encounter (Signed)
Caller is requesting status on Medical Clearance Forms faxed for completion/SLS Please Advise.

## 2013-05-08 NOTE — Telephone Encounter (Signed)
Anna from [facility] called back requesting this. Best # [phone]

## 2013-05-08 NOTE — Telephone Encounter (Signed)
Please fax form

## 2013-05-08 NOTE — Telephone Encounter (Signed)
Faxed form.

## 2013-05-12 ENCOUNTER — Telehealth: Payer: Self-pay | Admitting: Family Medicine

## 2013-05-12 NOTE — Telephone Encounter (Signed)
[facility] called back and states that they received the Medical Clearance form, but have a question RE: Plavix; does the patient need to stop medication prior to Root Canal procedure & if yes, for how long?./SLS Please Advise.

## 2013-05-12 NOTE — Telephone Encounter (Signed)
Refill- breeze 2 test disk dis. Use one test strip to test blood glucose once a day and as needed. Qty 100 last fill 8.12.14  Refill- clopidogrel 75mg  tab. Take one tablet by mouth once daily. Qty 30 last fill 8.4.14

## 2013-05-13 ENCOUNTER — Telehealth: Payer: Self-pay | Admitting: Family Medicine

## 2013-05-13 ENCOUNTER — Other Ambulatory Visit: Payer: Self-pay | Admitting: *Deleted

## 2013-05-13 MED ORDER — GLUCOSE BLOOD VI DISK: 1.0000 | DISK | Freq: Every day | Status: DC

## 2013-05-13 MED ORDER — CLOPIDOGREL BISULFATE 75 MG PO TABS: 75.0000 mg | ORAL_TABLET | Freq: Every day | ORAL | Status: DC

## 2013-05-13 NOTE — Telephone Encounter (Signed)
Stop Plavix 5 days prior to a major procedure like a root canal but not for routine for dental work. Start back the day after

## 2013-05-13 NOTE — Telephone Encounter (Signed)
Rx request to pharmacy/[facility]  

## 2013-05-13 NOTE — Telephone Encounter (Signed)
Refill breeze 2 test disk qty 100/90 use one test strip to test blood glucose once a day and as needed  Medicare requires diagnosis code

## 2013-05-13 NOTE — Telephone Encounter (Signed)
LMOM with contact name and number RE: medication mgt and further provider instructions/SLS

## 2013-05-13 NOTE — Telephone Encounter (Signed)
Dx Code: 250.40 was on the fax sent 11.11.14, boxed Joron information on prescription & refaxed Rx/SLS

## 2013-06-10 ENCOUNTER — Telehealth: Payer: Self-pay | Admitting: Family Medicine

## 2013-06-10 ENCOUNTER — Other Ambulatory Visit: Payer: Self-pay | Admitting: Family Medicine

## 2013-06-10 MED ORDER — SIMVASTATIN 20 MG PO TABS: 20.0000 mg | ORAL_TABLET | Freq: Every day | ORAL | Status: DC

## 2013-06-10 MED ORDER — GLIPIZIDE 5 MG PO TABS: 5.0000 mg | ORAL_TABLET | Freq: Every day | ORAL | Status: DC

## 2013-06-10 MED ORDER — SAXAGLIPTIN HCL 2.5 MG PO TABS: 2.5000 mg | ORAL_TABLET | Freq: Every day | ORAL | Status: DC

## 2013-06-10 NOTE — Telephone Encounter (Signed)
Refills sent

## 2013-06-10 NOTE — Telephone Encounter (Signed)
Refill request   Onglyza 2.5mg  tab, take 1 tab once daily, Qty 30  Glucotrol 5mg  tab, take 1 tab by mouth once daily, Qty 30  Zocor 20mg  tab, take 1 tab by mouth once daily, Qty30

## 2013-06-18 DIAGNOSIS — M21969 Unspecified acquired deformity of unspecified lower leg: Secondary | ICD-10-CM | POA: Diagnosis not present

## 2013-06-30 ENCOUNTER — Telehealth: Payer: Self-pay

## 2013-06-30 DIAGNOSIS — Z0000 Encounter for general adult medical examination without abnormal findings: Secondary | ICD-10-CM | POA: Diagnosis not present

## 2013-06-30 DIAGNOSIS — I10 Essential (primary) hypertension: Secondary | ICD-10-CM | POA: Diagnosis not present

## 2013-06-30 DIAGNOSIS — N189 Chronic kidney disease, unspecified: Secondary | ICD-10-CM | POA: Diagnosis not present

## 2013-06-30 DIAGNOSIS — E785 Hyperlipidemia, unspecified: Secondary | ICD-10-CM

## 2013-06-30 DIAGNOSIS — D649 Anemia, unspecified: Secondary | ICD-10-CM

## 2013-06-30 DIAGNOSIS — E1129 Type 2 diabetes mellitus with other diabetic kidney complication: Secondary | ICD-10-CM | POA: Diagnosis not present

## 2013-06-30 DIAGNOSIS — E1122 Type 2 diabetes mellitus with diabetic chronic kidney disease: Secondary | ICD-10-CM

## 2013-06-30 LAB — CBC
HCT: 34.1 % — ABNORMAL LOW (ref 39.0–52.0)
MCHC: 33.4 g/dL (ref 30.0–36.0)
MCV: 93.9 fL (ref 78.0–100.0)
RDW: 14 % (ref 11.5–15.5)

## 2013-06-30 LAB — HEMOGLOBIN A1C: Hgb A1c MFr Bld: 6.4 % — ABNORMAL HIGH (ref <5.7)

## 2013-06-30 NOTE — Telephone Encounter (Signed)
Lab order placed. I took out Vitamin B12 due to computer stating insurance would not cover this

## 2013-07-01 LAB — LIPID PANEL
Cholesterol: 126 mg/dL (ref 0–200)
HDL: 58 mg/dL (ref >39)
Total CHOL/HDL Ratio: 2.2 Ratio
Triglycerides: 47 mg/dL (ref <150)
VLDL: 9 mg/dL (ref 0–40)

## 2013-07-01 LAB — HEPATIC FUNCTION PANEL
ALT: 21 U/L (ref 0–53)
AST: 33 U/L (ref 0–37)
Albumin: 3.8 g/dL (ref 3.5–5.2)
Total Bilirubin: 0.5 mg/dL (ref 0.3–1.2)

## 2013-07-01 LAB — RENAL FUNCTION PANEL
BUN: 31 mg/dL — ABNORMAL HIGH (ref 6–23)
CO2: 27 mEq/L (ref 19–32)
Calcium: 9.2 mg/dL (ref 8.4–10.5)
Chloride: 108 mEq/L (ref 96–112)
Creat: 1.79 mg/dL — ABNORMAL HIGH (ref 0.50–1.35)
Phosphorus: 3.5 mg/dL (ref 2.3–4.6)

## 2013-07-01 LAB — TSH: TSH: 1.677 u[IU]/mL (ref 0.350–4.500)

## 2013-07-07 ENCOUNTER — Telehealth: Payer: Self-pay | Admitting: *Deleted

## 2013-07-07 ENCOUNTER — Ambulatory Visit (INDEPENDENT_AMBULATORY_CARE_PROVIDER_SITE_OTHER): Payer: Medicare Other | Admitting: Physician Assistant

## 2013-07-07 ENCOUNTER — Ambulatory Visit (HOSPITAL_BASED_OUTPATIENT_CLINIC_OR_DEPARTMENT_OTHER)
Admission: RE | Admit: 2013-07-07 | Discharge: 2013-07-07 | Disposition: A | Discharge status: Home / Self Care | Payer: Medicare Other | Source: Ambulatory Visit | Attending: Physician Assistant | Admitting: Physician Assistant

## 2013-07-07 ENCOUNTER — Encounter: Payer: Self-pay | Admitting: Physician Assistant

## 2013-07-07 ENCOUNTER — Telehealth: Payer: Self-pay | Admitting: Family Medicine

## 2013-07-07 VITALS — BP 134/64 | HR 82 | Temp 97.7°F | Resp 16 | Ht 65.0 in | Wt 139.2 lb

## 2013-07-07 DIAGNOSIS — R6889 Other general symptoms and signs: Secondary | ICD-10-CM

## 2013-07-07 DIAGNOSIS — Z7902 Long term (current) use of antithrombotics/antiplatelets: Secondary | ICD-10-CM

## 2013-07-07 DIAGNOSIS — Z01818 Encounter for other preprocedural examination: Secondary | ICD-10-CM | POA: Diagnosis not present

## 2013-07-07 DIAGNOSIS — Z5181 Encounter for therapeutic drug level monitoring: Secondary | ICD-10-CM | POA: Diagnosis not present

## 2013-07-07 DIAGNOSIS — R9389 Abnormal findings on diagnostic imaging of other specified body structures: Secondary | ICD-10-CM

## 2013-07-07 DIAGNOSIS — R899 Unspecified abnormal finding in specimens from other organs, systems and tissues: Secondary | ICD-10-CM

## 2013-07-07 LAB — COMPLETE METABOLIC PANEL WITH GFR
ALK PHOS: 62 U/L (ref 39–117)
ALT: 20 U/L (ref 0–53)
AST: 24 U/L (ref 0–37)
Albumin: 4.2 g/dL (ref 3.5–5.2)
BUN: 27 mg/dL — AB (ref 6–23)
CO2: 27 mEq/L (ref 19–32)
CREATININE: 1.79 mg/dL — AB (ref 0.50–1.35)
Calcium: 9.6 mg/dL (ref 8.4–10.5)
Chloride: 107 mEq/L (ref 96–112)
GFR, Est African American: 43 mL/min — ABNORMAL LOW
GFR, Est Non African American: 37 mL/min — ABNORMAL LOW
Glucose, Bld: 108 mg/dL — ABNORMAL HIGH (ref 70–99)
Potassium: 4.4 mEq/L (ref 3.5–5.3)
Sodium: 143 mEq/L (ref 135–145)
Total Bilirubin: 0.6 mg/dL (ref 0.3–1.2)
Total Protein: 7.5 g/dL (ref 6.0–8.3)

## 2013-07-07 LAB — CBC WITH DIFFERENTIAL/PLATELET
BASOS ABS: 0 10*3/uL (ref 0.0–0.1)
BASOS PCT: 0 % (ref 0–1)
EOS ABS: 0.1 10*3/uL (ref 0.0–0.7)
Eosinophils Relative: 2 % (ref 0–5)
HCT: 35.3 % — ABNORMAL LOW (ref 39.0–52.0)
Hemoglobin: 11.9 g/dL — ABNORMAL LOW (ref 13.0–17.0)
Lymphocytes Relative: 20 % (ref 12–46)
Lymphs Abs: 0.9 10*3/uL (ref 0.7–4.0)
MCH: 30.6 pg (ref 26.0–34.0)
MCHC: 33.7 g/dL (ref 30.0–36.0)
MCV: 90.7 fL (ref 78.0–100.0)
MONOS PCT: 10 % (ref 3–12)
Monocytes Absolute: 0.4 10*3/uL (ref 0.1–1.0)
NEUTROS PCT: 68 % (ref 43–77)
Neutro Abs: 3 10*3/uL (ref 1.7–7.7)
PLATELETS: 152 10*3/uL (ref 150–400)
RBC: 3.89 MIL/uL — ABNORMAL LOW (ref 4.22–5.81)
RDW: 14 % (ref 11.5–15.5)
WBC: 4.5 10*3/uL (ref 4.0–10.5)

## 2013-07-07 LAB — PROTIME-INR
INR: 1.01 (ref <1.50)
Prothrombin Time: 13.2 seconds (ref 11.6–15.2)

## 2013-07-07 NOTE — Telephone Encounter (Deleted)
Chest x-ray unremarkable for acute infection.  However there is evidence of possible pulmonary mass. Will place order for CT chest w/ contrast to assess for mass.  Please inform patient he will need to stop taking his Metfomin 24 hours before CT scan as the medication will interact with the IV dye.

## 2013-07-07 NOTE — Telephone Encounter (Signed)
Study Result    CLINICAL DATA: Preoperative chest radiograph.  EXAM:  CHEST 2 VIEW  COMPARISON: None.  FINDINGS:  There is no airspace disease or pleural effusion. Opacity is present  at the last scar that opacities present at the left apex, extending  from the left suprahilar region to the apical pleural surface. This  may represent an area of scarring associated with old granulomatous  disease. Neoplasm is impossible to exclude and a followup chest CT,  preferably with infusion is recommended.  IMPRESSION:  1. No acute cardiopulmonary disease.  2. Left apical density may represent scarring however pulmonary  nodule/neoplasm cannot be excluded. Followup chest CT recommended.  3. These results will be called to the ordering clinician or  representative by the Radiologist Assistant, and communication  documented [place] the PACS Dashboard.  Electronically Signed  By: Andreas NewportGeoffrey Lamke M.D.  On: 07/07/2013 12:10

## 2013-07-07 NOTE — Progress Notes (Signed)
Pre visit review using our clinic review tool, if applicable. No additional management support is needed unless otherwise documented below [place] the visit note/SLS  

## 2013-07-07 NOTE — Patient Instructions (Signed)
Please obtain labs.  Then proceed to the 1st floor for chest x-ray.  I will call you with your results and will send clearance for surgery to the surgeon as long as everything looks good.

## 2013-07-07 NOTE — Telephone Encounter (Signed)
refill-norvasc 10mg  tab. Take one tablet by mouth once daily. Qty 30 last fill 12.9.14

## 2013-07-08 ENCOUNTER — Ambulatory Visit (HOSPITAL_BASED_OUTPATIENT_CLINIC_OR_DEPARTMENT_OTHER)
Admission: RE | Admit: 2013-07-08 | Discharge: 2013-07-08 | Disposition: A | Discharge status: Home / Self Care | Payer: Medicare Other | Source: Ambulatory Visit | Attending: Physician Assistant | Admitting: Physician Assistant

## 2013-07-08 ENCOUNTER — Other Ambulatory Visit: Payer: Self-pay | Admitting: Physician Assistant

## 2013-07-08 DIAGNOSIS — N62 Hypertrophy of breast: Secondary | ICD-10-CM | POA: Insufficient documentation

## 2013-07-08 DIAGNOSIS — J984 Other disorders of lung: Secondary | ICD-10-CM | POA: Diagnosis not present

## 2013-07-08 DIAGNOSIS — R911 Solitary pulmonary nodule: Secondary | ICD-10-CM | POA: Insufficient documentation

## 2013-07-08 DIAGNOSIS — I700 Atherosclerosis of aorta: Secondary | ICD-10-CM | POA: Insufficient documentation

## 2013-07-08 DIAGNOSIS — Q619 Cystic kidney disease, unspecified: Secondary | ICD-10-CM | POA: Diagnosis not present

## 2013-07-08 DIAGNOSIS — I2510 Atherosclerotic heart disease of native coronary artery without angina pectoris: Secondary | ICD-10-CM | POA: Insufficient documentation

## 2013-07-08 DIAGNOSIS — R9389 Abnormal findings on diagnostic imaging of other specified body structures: Secondary | ICD-10-CM

## 2013-07-08 LAB — URINALYSIS, ROUTINE W REFLEX MICROSCOPIC
BILIRUBIN URINE: NEGATIVE
Glucose, UA: NEGATIVE mg/dL
HGB URINE DIPSTICK: NEGATIVE
KETONES UR: NEGATIVE mg/dL
Leukocytes, UA: NEGATIVE
NITRITE: NEGATIVE
PROTEIN: 100 mg/dL — AB
Specific Gravity, Urine: 1.013 (ref 1.005–1.030)
UROBILINOGEN UA: 0.2 mg/dL (ref 0.0–1.0)
pH: 5 (ref 5.0–8.0)

## 2013-07-08 LAB — URINALYSIS, MICROSCOPIC ONLY
Bacteria, UA: NONE SEEN
CRYSTALS: NONE SEEN
Casts: NONE SEEN
SQUAMOUS EPITHELIAL / LPF: NONE SEEN

## 2013-07-08 MED ORDER — AMLODIPINE BESYLATE 10 MG PO TABS
10.0000 mg | ORAL_TABLET | Freq: Every day | ORAL | Status: DC
Start: 2013-07-08 — End: 2013-12-05

## 2013-07-09 ENCOUNTER — Telehealth: Payer: Self-pay | Admitting: Physician Assistant

## 2013-07-09 ENCOUNTER — Encounter: Payer: Self-pay | Admitting: Physician Assistant

## 2013-07-09 ENCOUNTER — Other Ambulatory Visit: Payer: Self-pay | Admitting: *Deleted

## 2013-07-09 DIAGNOSIS — Z01818 Encounter for other preprocedural examination: Secondary | ICD-10-CM | POA: Insufficient documentation

## 2013-07-09 DIAGNOSIS — H40029 Open angle with borderline findings, high risk, unspecified eye: Secondary | ICD-10-CM | POA: Diagnosis not present

## 2013-07-09 DIAGNOSIS — H2510 Age-related nuclear cataract, unspecified eye: Secondary | ICD-10-CM | POA: Diagnosis not present

## 2013-07-09 NOTE — Progress Notes (Signed)
Called patient personally.  Patient voices understanding.  Patient will call his surgeon to verify they have the surgical clearance forms that we sent.

## 2013-07-09 NOTE — Assessment & Plan Note (Addendum)
EKG reveals NSR at rate of 79 bpm.  Will obtain CXR. Will obtain CBC, CMP, UA, PT/INR.  Will fax over medical clearance for surgery once labs are reviewed.  Of note, patient does have CKD.  Baseline Cr at 1.7.

## 2013-07-09 NOTE — Telephone Encounter (Signed)
Medical clearance forms sent.  Patient notified.

## 2013-07-09 NOTE — Progress Notes (Signed)
Patient ID: Henry Turner, male   DOB: 1940-08-24, 73 y.o.   MRN: [id number]  Patient presents to clinic today to obtain medical clearance for an upcoming surgical procedure. Patient is scheduled to have surgical pin removal of Left ankle at the end of this month by Dr. Jacqlyn Larsen of [facility].  Patient will have procedure performed under general anesthesia.  Patient's PMH is significant for HTN, CAD, DM, and CKD.  Patient is followed by Cardiology and has upcoming appointment for cardiology clearance for surgery.  Patient denies recent illness.  Denies fever, chills, sweats, malaise/fatigue.  Patient denies chest pain, palpitations, vision changes, LH or dizziness.  Patient endorses taking medications as prescribed.  Is currently on Aspirin 81 mg and Plavix daily.  BP good Henry Turner clinic.  Past Medical History  Diagnosis Date  . CAD (coronary artery disease)     NSTEMI 2008 with tandem severe mid LAD stenoses treated with Xience V drug eluting stents. Lexiscan myovew [place] 3/11 showed EF 58%, no ischemia or infarction  . Hyperlipidemia   . Diabetes mellitus   . Post-polio syndrome     leg weakness  . HTN (hypertension)   . Anemia 04/12/2013    Current Outpatient Prescriptions on File Prior to Visit  Medication Sig Dispense Refill  . aspirin 81 MG tablet Take 81 mg by mouth daily.        . clopidogrel (PLAVIX) 75 MG tablet Take 1 tablet (75 mg total) by mouth daily.  30 tablet  2  . fish oil-omega-3 fatty acids 1000 MG capsule Take 2 capsules (2 g total) by mouth daily.      [name] Kitchen glipiZIDE (GLUCOTROL) 5 MG tablet Take 1 tablet (5 mg total) by mouth daily.  30 tablet  2  . Glucose Blood (BAYER BREEZE 2 TEST) DISK 1 each by Henry Turner Vitro route daily. Check every day and prn. DX 250.40  100 each  1  . saxagliptin HCl (ONGLYZA) 2.5 MG TABS tablet Take 1 tablet (2.5 mg total) by mouth daily.  30 tablet  2  . simvastatin (ZOCOR) 20 MG tablet Take 1 tablet (20 mg total) by mouth daily.  30  tablet  2   No current facility-administered medications on file prior to visit.    No Known Allergies  No family history on file.  History   Social History  . Marital Status: Single    Spouse Name: N/A    Number of Children: N/A  . Years of Education: N/A   Social History Main Topics  . Smoking status: Former Smoker    Types: Cigarettes  . Smokeless tobacco: None     Comment: >20 yrs  . Alcohol Use: None  . Drug Use: None  . Sexual Activity: None   Other Topics Concern  . None   Social History Narrative   Lives [place] with family. Retired from [employer]. No family hx of premature CAD.    Review of Systems - See HPI.  All other ROS are negative.  Filed Vitals:   07/07/13 0943  BP: 134/64  Pulse: 82  Temp: 97.7 F (36.5 C)  Resp: 16   Physical Exam  Vitals reviewed. Constitutional: He is oriented to person, place, and time and well-developed, well-nourished, and Henry Turner no distress.  HENT:  Head: Normocephalic and atraumatic.  Right Ear: External ear normal.  Left Ear: External ear normal.  Nose: Nose normal.  Mouth/Throat: Oropharynx is clear and moist. No oropharyngeal exudate.  Eyes: Conjunctivae and EOM are normal. Pupils are equal, round, and reactive to light.  Neck: Neck supple.  Cardiovascular: Normal rate, regular rhythm, normal heart sounds and intact distal pulses.   Pulmonary/Chest: Effort normal and breath sounds normal. No respiratory distress. He has no wheezes. He has no rales. He exhibits no tenderness.  Lymphadenopathy:    He has no cervical adenopathy.  Neurological: He is alert and oriented to person, place, and time. No cranial nerve deficit.  Skin: Skin is warm and dry. No rash noted.  Psychiatric: Affect normal.    Recent Results (from the past 2160 hour(s))  FECAL OCCULT BLOOD, IMMUNOCHEMICAL     Status: None   Collection Time    04/15/13 11:20 AM      Result Value Range   Fecal Occult Bld Negative  Negative   LIPID PANEL     Status: None   Collection Time    06/30/13  5:04 PM      Result Value Range   Cholesterol 126  0 - 200 mg/dL   Comment: ATP III Classification:           < 200        mg/dL        Desirable          200 - 239     mg/dL        Borderline High          >= 240        mg/dL        High         Triglycerides 47  <150 mg/dL   HDL 58  >39 mg/dL   Total CHOL/HDL Ratio 2.2     VLDL 9  0 - 40 mg/dL   LDL Cholesterol 59  0 - 99 mg/dL   Comment:       Total Cholesterol/HDL Ratio:CHD Risk                            Coronary Heart Disease Risk Table                                            Men       Women              1/2 Average Risk              3.4        3.3                  Average Risk              5.0        4.4               2X Average Risk              9.6        7.1               3X Average Risk             23.4       11.0     Use the calculated Patient Ratio above and the CHD Risk table      to determine the patient's CHD Risk.     ATP III Classification (LDL):           <  100        mg/dL         Optimal          100 - 129     mg/dL         Near or Above Optimal          130 - 159     mg/dL         Borderline High          160 - 189     mg/dL         High           > 190        mg/dL         Very High        RENAL FUNCTION PANEL     Status: Abnormal   Collection Time    06/30/13  5:04 PM      Result Value Range   Sodium 143  135 - 145 mEq/L   Potassium 4.7  3.5 - 5.3 mEq/L   Chloride 108  96 - 112 mEq/L   CO2 27  19 - 32 mEq/L   Glucose, Bld 99  70 - 99 mg/dL   BUN 31 (*) 6 - 23 mg/dL   Creat 1.79 (*) 0.50 - 1.35 mg/dL   Albumin 3.8  3.5 - 5.2 g/dL   Calcium 9.2  8.4 - 10.5 mg/dL   Phosphorus 3.5  2.3 - 4.6 mg/dL  CBC     Status: Abnormal   Collection Time    06/30/13  5:04 PM      Result Value Range   WBC 3.8 (*) 4.0 - 10.5 K/uL   RBC 3.63 (*) 4.22 - 5.81 MIL/uL   Hemoglobin 11.4 (*) 13.0 - 17.0 g/dL   HCT 34.1 (*) 39.0 - 52.0 %   MCV 93.9   78.0 - 100.0 fL   MCH 31.4  26.0 - 34.0 pg   MCHC 33.4  30.0 - 36.0 g/dL   RDW 14.0  11.5 - 15.5 %   Platelets 137 (*) 150 - 400 K/uL  TSH     Status: None   Collection Time    06/30/13  5:04 PM      Result Value Range   TSH 1.677  0.350 - 4.500 uIU/mL  HEPATIC FUNCTION PANEL     Status: None   Collection Time    06/30/13  5:04 PM      Result Value Range   Total Bilirubin 0.5  0.3 - 1.2 mg/dL   Bilirubin, Direct 0.1  0.0 - 0.3 mg/dL   Indirect Bilirubin 0.4  0.0 - 0.9 mg/dL   Alkaline Phosphatase 60  39 - 117 U/L   AST 33  0 - 37 U/L   ALT 21  0 - 53 U/L   Total Protein 7.0  6.0 - 8.3 g/dL   Albumin 3.8  3.5 - 5.2 g/dL  HEMOGLOBIN A1C     Status: Abnormal   Collection Time    06/30/13  5:04 PM      Result Value Range   Hemoglobin A1C 6.4 (*) <5.7 %   Comment:  According to the ADA Clinical Practice Recommendations for 2011, when     HbA1c is used as a screening test:             >=6.5%   Diagnostic of Diabetes Mellitus                (if abnormal result is confirmed)           5.7-6.4%   Increased risk of developing Diabetes Mellitus           References:Diagnosis and Classification of Diabetes Mellitus,Diabetes     SJGG,8366,29(UTMLY 1):S62-S69 and Standards of Medical Care [place]             Diabetes - 2011,Diabetes [id number],5465,68 (Suppl 1):S11-S61.         Mean Plasma Glucose 137 (*) <117 mg/dL  INTRINSIC FACTOR ANTIBODIES     Status: None   Collection Time    06/30/13  5:04 PM      Result Value Range   Intrinsic Factor Negative  Negative  CBC WITH DIFFERENTIAL     Status: Abnormal   Collection Time    07/07/13 10:45 AM      Result Value Range   WBC 4.5  4.0 - 10.5 K/uL   RBC 3.89 (*) 4.22 - 5.81 MIL/uL   Hemoglobin 11.9 (*) 13.0 - 17.0 g/dL   HCT 35.3 (*) 39.0 - 52.0 %   MCV 90.7  78.0 - 100.0 fL   MCH 30.6  26.0 - 34.0 pg   MCHC 33.7  30.0 - 36.0 g/dL   RDW 14.0  11.5 - 15.5 %    Platelets 152  150 - 400 K/uL   Neutrophils Relative % 68  43 - 77 %   Neutro Abs 3.0  1.7 - 7.7 K/uL   Lymphocytes Relative 20  12 - 46 %   Lymphs Abs 0.9  0.7 - 4.0 K/uL   Monocytes Relative 10  3 - 12 %   Monocytes Absolute 0.4  0.1 - 1.0 K/uL   Eosinophils Relative 2  0 - 5 %   Eosinophils Absolute 0.1  0.0 - 0.7 K/uL   Basophils Relative 0  0 - 1 %   Basophils Absolute 0.0  0.0 - 0.1 K/uL   Smear Review Criteria for review not met    PROTIME-INR     Status: None   Collection Time    07/07/13 10:45 AM      Result Value Range   Prothrombin Time 13.2  11.6 - 15.2 seconds   INR 1.01  <1.50   Comment: The INR is of principal utility [facility] following patients on stable doses     of oral anticoagulants.  The therapeutic range is generally 2.0 to     3.0, but may be 3.0 to 4.0 Rayan patients with mechanical cardiac valves,     recurrent embolisms and antiphospholipid antibodies (including lupus     inhibitors).  URINALYSIS, ROUTINE W REFLEX MICROSCOPIC     Status: Abnormal   Collection Time    07/07/13 10:45 AM      Result Value Range   Color, Urine YELLOW  YELLOW   APPearance CLEAR  CLEAR   Specific Gravity, Urine 1.013  1.005 - 1.030   pH 5.0  5.0 - 8.0   Glucose, UA NEG  NEG mg/dL   Bilirubin Urine NEG  NEG   Ketones, ur NEG  NEG mg/dL   Hgb urine dipstick NEG  NEG   Protein, ur 100 (*)  NEG mg/dL   Urobilinogen, UA 0.2  0.0 - 1.0 mg/dL   Nitrite NEG  NEG   Leukocytes, UA NEG  NEG  COMPLETE METABOLIC PANEL WITH GFR     Status: Abnormal   Collection Time    07/07/13 10:45 AM      Result Value Range   Sodium 143  135 - 145 mEq/L   Potassium 4.4  3.5 - 5.3 mEq/L   Chloride 107  96 - 112 mEq/L   CO2 27  19 - 32 mEq/L   Glucose, Bld 108 (*) 70 - 99 mg/dL   BUN 27 (*) 6 - 23 mg/dL   Creat 1.79 (*) 0.50 - 1.35 mg/dL   Total Bilirubin 0.6  0.3 - 1.2 mg/dL   Alkaline Phosphatase 62  39 - 117 U/L   AST 24  0 - 37 U/L   ALT 20  0 - 53 U/L   Total Protein 7.5  6.0 - 8.3 g/dL    Albumin 4.2  3.5 - 5.2 g/dL   Calcium 9.6  8.4 - 10.5 mg/dL   GFR, Est African American 43 (*)    GFR, Est Non African American 37 (*)    Comment:       The estimated GFR is a calculation valid for adults (>=47 years old)     that uses the CKD-EPI algorithm to adjust for age and sex. It is       not to be used for children, pregnant women, hospitalized patients,        patients on dialysis, or with rapidly changing kidney function.     According to the NKDEP, eGFR >89 is normal, 60-89 shows mild     impairment, 30-59 shows moderate impairment, 15-29 shows severe     impairment and <15 is ESRD.        URINALYSIS, MICROSCOPIC ONLY     Status: None   Collection Time    07/07/13 10:45 AM      Result Value Range   Squamous Epithelial / LPF NONE SEEN  RARE   Crystals NONE SEEN  NONE SEEN   Casts NONE SEEN  NONE SEEN   WBC, UA 0-2  <3 WBC/hpf   RBC / HPF 0-2  <3 RBC/hpf   Bacteria, UA NONE SEEN  RARE    Assessment/Plan: Preoperative clearance EKG reveals NSR at rate of 79 bpm.  Will obtain CXR. Will obtain CBC, CMP, UA, PT/INR.  Will fax over medical clearance for surgery once labs are reviewed.  Of note, patient does have CKD.  Baseline Cr at 1.7.

## 2013-07-10 ENCOUNTER — Ambulatory Visit (INDEPENDENT_AMBULATORY_CARE_PROVIDER_SITE_OTHER): Payer: Medicare Other | Admitting: Cardiology

## 2013-07-10 ENCOUNTER — Encounter: Payer: Self-pay | Admitting: Cardiology

## 2013-07-10 VITALS — BP 144/62 | HR 76 | Ht 65.0 in | Wt 139.0 lb

## 2013-07-10 DIAGNOSIS — I2510 Atherosclerotic heart disease of native coronary artery without angina pectoris: Secondary | ICD-10-CM

## 2013-07-10 DIAGNOSIS — Z01818 Encounter for other preprocedural examination: Secondary | ICD-10-CM | POA: Diagnosis not present

## 2013-07-10 DIAGNOSIS — I10 Essential (primary) hypertension: Secondary | ICD-10-CM | POA: Diagnosis not present

## 2013-07-10 DIAGNOSIS — N189 Chronic kidney disease, unspecified: Secondary | ICD-10-CM

## 2013-07-10 NOTE — Patient Instructions (Addendum)
Dr Henry Turner said you may hold your Plavix for 5 days before your surgery and you may  hold your aspirin for 3 days before  Surgery.  Your physician wants you to follow-up Henry Turner: 1 year with Dr Henry Turner. (January 2016).  You will receive a reminder letter Henry Turner the mail two months Henry [place] advance. If you don't receive a letter, please call our office to schedule the follow-up appointment.

## 2013-07-11 NOTE — Progress Notes (Signed)
Patient ID: Henry Turner, male   DOB: [birth year], 73 y.o.   MRN: [id number] PCP: Dr. Abner Turner  73 yo with history of CAD s/p LAD PCI and diabetes mellitus presents for cardiology followup.  Patient had a NSTEMI Henry Turner with tandem severe LAD stenoses that were treated with Xience drug eluting stents.  Since that time, he has done well.  Last myoview was Henry Turner and showed no evidence of ischemia or infarction per report.  Patient has had no recent chest pain.  He has no exertional dyspnea.  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  He has mild stable ankle edema.  He does get some exercise by walking or riding a stationary bike.  He spends part of the year Henry Turner and part of the year Henry Turner with family. Weight is stable.  He needs surgical removal of pins from his left ankle.  This will require general anesthesia (will be done Henry Turner).   ECG: NSR, normal   Labs (12/10): LDL 54, HDL 54, creatinine 1.6 Labs (12/11): LDL 68, HDL 39, K 4.9, creatinine 1.6 Labs (6/12): LDL 65, HDL 39 Labs (7/12): K 4.6, creatinine 1.7 Labs (10/13): LDL 60, HDL 49 Labs (4/14): K 5.1, creatinine 1.76 Labs (12/14): LDL 59, HDL 58, K 4.4, creatinine 4.091.79  Allergies (verified):  No Known Drug Allergies  Past Medical History: 1. CAD: NSTEMI Turner with tandem severe mid LAD stenoses treated with Xience V drug eluting stents.  Lexiscan myovew Henry Turner showed EF 58%, no ischemia or infarction.  2. Hyperlipidemia 3. Diabetes mellitus 4. Post-polio syndrome with leg weakness 5. HTN 6. CKD: Creatinine around 1.7 chronically.   Family History: No premature CAD  Social History: Lives Henry Turner with family Retired from [employer] Nonsmoker   Current Outpatient Prescriptions  Medication Sig Dispense Refill  . amLODipine (NORVASC) 10 MG tablet Take 1 tablet (10 mg total) by mouth daily.  30 tablet  4  . aspirin 81 MG tablet Take 81 mg by mouth daily.        . clopidogrel (PLAVIX)  75 MG tablet Take 1 tablet (75 mg total) by mouth daily.  30 tablet  2  . fish oil-omega-3 fatty acids 1000 MG capsule Take 2 capsules (2 g total) by mouth daily.      [name] Kitchen. glipiZIDE (GLUCOTROL) 5 MG tablet Take 1 tablet (5 mg total) by mouth daily.  30 tablet  2  . Glucose Blood (BAYER BREEZE 2 TEST) DISK 1 each by Henry Turner route daily. Check every day and prn. DX 250.40  100 each  1  . saxagliptin HCl (ONGLYZA) 2.5 MG TABS tablet Take 1 tablet (2.5 mg total) by mouth daily.  30 tablet  2  . simvastatin (ZOCOR) 20 MG tablet Take 1 tablet (20 mg total) by mouth daily.  30 tablet  2   No current facility-administered medications for this visit.    BP 144/62  Pulse 76  Ht 5\' 5"  (1.651 m)  Wt 63.05 kg (139 lb)  BMI 23.13 kg/m2 General: NAD Neck: No JVD, no thyromegaly or thyroid nodule.  Lungs: Clear to auscultation bilaterally with normal respiratory effort. CV: Nondisplaced PMI.  Heart regular S1/S2, no S3/S4, no murmur.  Trace ankle edema bilaterally.  No carotid bruit.  Normal pedal pulses.  Bilateral lower leg varicosities.  Abdomen: Soft, nontender, no hepatosplenomegaly, no distention.  Neurologic: Alert and oriented x 3.  Psych: Normal affect. Extremities: No clubbing or cyanosis.  Assessment/Plan:  CAD, NATIVE VESSEL Patient had NSTEMI with 2 drug eluting stents Henry Turner the mid LAD Henry Turner Turner.  No ischemic symptoms. Normal Lexiscan myoview Henry Turner.  I will continue current meds: ASA, Plavix, statin, metoprolol.  No ACEI at this time due to CKD.  HYPERLIPIDEMIA-MIXED Good lipids Henry Turner 12/14.  HYPERTENSION, UNSPECIFIED  BP well-controlled, continue current meds.   CKD Creatinine has been stable.  Pre-operative evaluation Patient needs surgical removal of pins from his left ankle.  This will be done under general anesthesia.  He has reasonable exercise tolerance and has had no recent ischemic symptoms.  He can undergo surgery without further workup.  He will need to stop Plavix 5 days prior  to procedure and ASA 3 days prior to procedure. Restart afterwards.   Henry Turner 07/11/2013 11:53 PM

## 2013-07-14 ENCOUNTER — Encounter: Payer: Self-pay | Admitting: Family Medicine

## 2013-07-14 ENCOUNTER — Ambulatory Visit (INDEPENDENT_AMBULATORY_CARE_PROVIDER_SITE_OTHER): Payer: Medicare Other | Admitting: Family Medicine

## 2013-07-14 VITALS — BP 152/72 | HR 76 | Temp 97.8°F | Ht 65.0 in | Wt 139.0 lb

## 2013-07-14 DIAGNOSIS — E1122 Type 2 diabetes mellitus with diabetic chronic kidney disease: Secondary | ICD-10-CM

## 2013-07-14 DIAGNOSIS — H35349 Macular cyst, hole, or pseudohole, unspecified eye: Secondary | ICD-10-CM | POA: Diagnosis not present

## 2013-07-14 DIAGNOSIS — H35379 Puckering of macula, unspecified eye: Secondary | ICD-10-CM | POA: Diagnosis not present

## 2013-07-14 DIAGNOSIS — I2510 Atherosclerotic heart disease of native coronary artery without angina pectoris: Secondary | ICD-10-CM | POA: Diagnosis not present

## 2013-07-14 DIAGNOSIS — N189 Chronic kidney disease, unspecified: Secondary | ICD-10-CM | POA: Diagnosis not present

## 2013-07-14 DIAGNOSIS — E1129 Type 2 diabetes mellitus with other diabetic kidney complication: Secondary | ICD-10-CM

## 2013-07-14 DIAGNOSIS — I10 Essential (primary) hypertension: Secondary | ICD-10-CM | POA: Diagnosis not present

## 2013-07-14 NOTE — Progress Notes (Signed)
Pre visit review using our clinic review tool, if applicable. No additional management support is needed unless otherwise documented below [place] the visit note. 

## 2013-07-17 ENCOUNTER — Telehealth: Payer: Self-pay | Admitting: Cardiology

## 2013-07-17 NOTE — Telephone Encounter (Signed)
LM for Annice PihJackie pt may have surgery without further cardiac workup and will fax Dr Alford HighlandMcLean's 07/10/13 office note with this information.

## 2013-07-17 NOTE — Telephone Encounter (Signed)
New message  Surgery on 1/19 .    Status of cardiac clearance - screws  remove from his ankle.

## 2013-07-17 NOTE — Telephone Encounter (Signed)
LOV faxed.

## 2013-07-20 NOTE — Assessment & Plan Note (Signed)
Well controlled, no changes 

## 2013-07-20 NOTE — Assessment & Plan Note (Signed)
Well controlled, avoid simple carbs, continue current meds 

## 2013-07-20 NOTE — Assessment & Plan Note (Signed)
Asymptomatic, following with cardiology 

## 2013-07-21 ENCOUNTER — Ambulatory Visit: Payer: Medicare Other | Admitting: Family Medicine

## 2013-07-21 ENCOUNTER — Encounter: Payer: Self-pay | Admitting: Family Medicine

## 2013-07-21 NOTE — Progress Notes (Signed)
Patient ID: Henry Turner, male   DOB: 06/05/1941, 73 y.o.   MRN: [id number] Henry Turner [id number] [birth year] 07/21/2013      Progress Note-Follow Up  Subjective  Chief Complaint  Chief Complaint  Patient presents with  . paperwork    HPI  Patient is a 73 year old Asian male who is [place] today for followup and a have Department of motor vehicle forms filled out. He feels well. He denies any recent illness. He's not had any hospitalization [place] the last 2 years. Denies chest pain, palpitations, headaches, shortness of breath, GI or GU concerns. Take medications as prescribed  Past Medical History  Diagnosis Date  . CAD (coronary artery disease)     NSTEMI 2008 with tandem severe mid LAD stenoses treated with Xience V drug eluting stents. Lexiscan myovew [place] 3/11 showed EF 58%, no ischemia or infarction  . Hyperlipidemia   . Diabetes mellitus   . Post-polio syndrome     leg weakness  . HTN (hypertension)   . Anemia 04/12/2013    Past Surgical History  Procedure Laterality Date  . Drug eluting stents      History reviewed. No pertinent family history.  History   Social History  . Marital Status: Single    Spouse Name: N/A    Number of Children: N/A  . Years of Education: N/A   Occupational History  . Not on file.   Social History Main Topics  . Smoking status: Former Smoker    Types: Cigarettes  . Smokeless tobacco: Not on file     Comment: >20 yrs  . Alcohol Use: Not on file  . Drug Use: Not on file  . Sexual Activity: Not on file   Other Topics Concern  . Not on file   Social History Narrative   Lives [place] with family. Retired from [employer]. No family hx of premature CAD.     Current Outpatient Prescriptions on File Prior to Visit  Medication Sig Dispense Refill  . amLODipine (NORVASC) 10 MG tablet Take 1 tablet (10 mg total) by mouth daily.  30 tablet  4  . aspirin 81 MG tablet Take 81 mg by mouth daily.        . clopidogrel (PLAVIX)  75 MG tablet Take 1 tablet (75 mg total) by mouth daily.  30 tablet  2  . fish oil-omega-3 fatty acids 1000 MG capsule Take 2 capsules (2 g total) by mouth daily.      [name] Kitchen glipiZIDE (GLUCOTROL) 5 MG tablet Take 1 tablet (5 mg total) by mouth daily.  30 tablet  2  . Glucose Blood (BAYER BREEZE 2 TEST) DISK 1 each by Raiford Vitro route daily. Check every day and prn. DX 250.40  100 each  1  . saxagliptin HCl (ONGLYZA) 2.5 MG TABS tablet Take 1 tablet (2.5 mg total) by mouth daily.  30 tablet  2  . simvastatin (ZOCOR) 20 MG tablet Take 1 tablet (20 mg total) by mouth daily.  30 tablet  2   No current facility-administered medications on file prior to visit.    No Known Allergies  Review of Systems  Review of Systems  Constitutional: Negative for fever and malaise/fatigue.  HENT: Negative for congestion.   Eyes: Negative for discharge.  Respiratory: Negative for shortness of breath.   Cardiovascular: Negative for chest pain, palpitations and leg swelling.  Gastrointestinal: Negative for nausea, abdominal pain and diarrhea.  Genitourinary: Negative for dysuria.  Musculoskeletal: Negative for falls.  Skin: Negative for rash.  Neurological: Negative for loss of consciousness and headaches.  Endo/Heme/Allergies: Negative for polydipsia.  Psychiatric/Behavioral: Negative for depression and suicidal ideas. The patient is not nervous/anxious and does not have insomnia.     Objective  BP 152/72  Pulse 76  Temp(Src) 97.8 F (36.6 C) (Oral)  Ht 5\' 5"  (1.651 m)  Wt 139 lb (63.05 kg)  BMI 23.13 kg/m2  SpO2 98%  Physical Exam  Physical Exam  Constitutional: He is oriented to person, place, and time and well-developed, well-nourished, and Jahmarion no distress. No distress.  HENT:  Head: Normocephalic and atraumatic.  Eyes: Conjunctivae are normal.  Neck: Neck supple. No thyromegaly present.  Cardiovascular: Normal rate, regular rhythm and normal heart sounds.   No murmur heard. Pulmonary/Chest:  Effort normal and breath sounds normal. No respiratory distress.  Abdominal: He exhibits no distension and no mass. There is no tenderness.  Musculoskeletal: He exhibits no edema.  Neurological: He is alert and oriented to person, place, and time.  Skin: Skin is warm.  Psychiatric: Memory, affect and judgment normal.    Lab Results  Component Value Date   TSH 1.677 06/30/2013   Lab Results  Component Value Date   WBC 4.5 07/07/2013   HGB 11.9* 07/07/2013   HCT 35.3* 07/07/2013   MCV 90.7 07/07/2013   PLT 152 07/07/2013   Lab Results  Component Value Date   CREATININE 1.79* 07/07/2013   BUN 27* 07/07/2013   NA 143 07/07/2013   K 4.4 07/07/2013   CL 107 07/07/2013   CO2 27 07/07/2013   Lab Results  Component Value Date   ALT 20 07/07/2013   AST 24 07/07/2013   ALKPHOS 62 07/07/2013   BILITOT 0.6 07/07/2013   Lab Results  Component Value Date   CHOL 126 06/30/2013   Lab Results  Component Value Date   HDL 58 06/30/2013   Lab Results  Component Value Date   LDLCALC 59 06/30/2013   Lab Results  Component Value Date   TRIG 47 06/30/2013   Lab Results  Component Value Date   CHOLHDL 2.2 06/30/2013     Assessment & Plan  HYPERTENSION, UNSPECIFIED Well controlled, no changes  CAD, NATIVE VESSEL Asymptomatic, following with cardiology  Diabetes mellitus with chronic kidney disease Well controlled, avoid simple carbs, continue current meds

## 2013-07-28 DIAGNOSIS — T8489XA Other specified complication of internal orthopedic prosthetic devices, implants and grafts, initial encounter: Secondary | ICD-10-CM | POA: Diagnosis not present

## 2013-07-28 DIAGNOSIS — T847XXA Infection and inflammatory reaction due to other internal orthopedic prosthetic devices, implants and grafts, initial encounter: Secondary | ICD-10-CM | POA: Diagnosis not present

## 2013-07-28 DIAGNOSIS — Z9889 Other specified postprocedural states: Secondary | ICD-10-CM | POA: Diagnosis not present

## 2013-07-28 DIAGNOSIS — Z87891 Personal history of nicotine dependence: Secondary | ICD-10-CM | POA: Diagnosis not present

## 2013-07-28 DIAGNOSIS — Z9861 Coronary angioplasty status: Secondary | ICD-10-CM | POA: Diagnosis not present

## 2013-07-28 DIAGNOSIS — I2510 Atherosclerotic heart disease of native coronary artery without angina pectoris: Secondary | ICD-10-CM | POA: Diagnosis not present

## 2013-07-28 DIAGNOSIS — M25579 Pain in unspecified ankle and joints of unspecified foot: Secondary | ICD-10-CM | POA: Diagnosis not present

## 2013-07-28 DIAGNOSIS — I10 Essential (primary) hypertension: Secondary | ICD-10-CM | POA: Diagnosis not present

## 2013-07-28 DIAGNOSIS — E119 Type 2 diabetes mellitus without complications: Secondary | ICD-10-CM | POA: Diagnosis not present

## 2013-08-06 DIAGNOSIS — Z4789 Encounter for other orthopedic aftercare: Secondary | ICD-10-CM | POA: Diagnosis not present

## 2013-09-01 ENCOUNTER — Ambulatory Visit: Payer: Medicare Other | Admitting: Family Medicine

## 2013-09-08 ENCOUNTER — Other Ambulatory Visit: Payer: Self-pay | Admitting: Family Medicine

## 2013-09-17 DIAGNOSIS — R7989 Other specified abnormal findings of blood chemistry: Secondary | ICD-10-CM | POA: Diagnosis not present

## 2013-09-26 ENCOUNTER — Encounter: Payer: Medicare Other | Admitting: Family Medicine

## 2013-10-06 ENCOUNTER — Other Ambulatory Visit: Payer: Self-pay | Admitting: Family Medicine

## 2013-10-15 ENCOUNTER — Ambulatory Visit: Payer: Medicare Other | Admitting: Cardiology

## 2013-11-11 DIAGNOSIS — H40029 Open angle with borderline findings, high risk, unspecified eye: Secondary | ICD-10-CM | POA: Diagnosis not present

## 2013-11-18 ENCOUNTER — Other Ambulatory Visit: Payer: Self-pay | Admitting: Family Medicine

## 2013-11-21 ENCOUNTER — Other Ambulatory Visit: Payer: Self-pay

## 2013-12-05 ENCOUNTER — Other Ambulatory Visit: Payer: Self-pay | Admitting: Family Medicine

## 2014-01-05 ENCOUNTER — Ambulatory Visit (INDEPENDENT_AMBULATORY_CARE_PROVIDER_SITE_OTHER): Payer: Medicare Other | Admitting: Family Medicine

## 2014-01-05 ENCOUNTER — Encounter: Payer: Self-pay | Admitting: Family Medicine

## 2014-01-05 VITALS — BP 138/64 | HR 76 | Temp 98.0°F | Ht 65.0 in | Wt 137.0 lb

## 2014-01-05 DIAGNOSIS — IMO0002 Reserved for concepts with insufficient information to code with codable children: Secondary | ICD-10-CM

## 2014-01-05 DIAGNOSIS — E785 Hyperlipidemia, unspecified: Secondary | ICD-10-CM

## 2014-01-05 DIAGNOSIS — E1129 Type 2 diabetes mellitus with other diabetic kidney complication: Secondary | ICD-10-CM | POA: Diagnosis not present

## 2014-01-05 DIAGNOSIS — D6489 Other specified anemias: Secondary | ICD-10-CM

## 2014-01-05 DIAGNOSIS — E118 Type 2 diabetes mellitus with unspecified complications: Secondary | ICD-10-CM

## 2014-01-05 DIAGNOSIS — I2510 Atherosclerotic heart disease of native coronary artery without angina pectoris: Secondary | ICD-10-CM | POA: Diagnosis not present

## 2014-01-05 DIAGNOSIS — E1165 Type 2 diabetes mellitus with hyperglycemia: Secondary | ICD-10-CM

## 2014-01-05 DIAGNOSIS — E1122 Type 2 diabetes mellitus with diabetic chronic kidney disease: Secondary | ICD-10-CM

## 2014-01-05 DIAGNOSIS — N189 Chronic kidney disease, unspecified: Secondary | ICD-10-CM

## 2014-01-05 DIAGNOSIS — D649 Anemia, unspecified: Secondary | ICD-10-CM

## 2014-01-05 DIAGNOSIS — I10 Essential (primary) hypertension: Secondary | ICD-10-CM | POA: Diagnosis not present

## 2014-01-05 LAB — RENAL FUNCTION PANEL
Albumin: 4.3 g/dL (ref 3.5–5.2)
BUN: 40 mg/dL — ABNORMAL HIGH (ref 6–23)
CHLORIDE: 108 meq/L (ref 96–112)
CO2: 22 meq/L (ref 19–32)
CREATININE: 1.78 mg/dL — AB (ref 0.50–1.35)
Calcium: 8.6 mg/dL (ref 8.4–10.5)
Glucose, Bld: 105 mg/dL — ABNORMAL HIGH (ref 70–99)
PHOSPHORUS: 3.6 mg/dL (ref 2.3–4.6)
POTASSIUM: 4.6 meq/L (ref 3.5–5.3)
Sodium: 140 mEq/L (ref 135–145)

## 2014-01-05 LAB — CBC
HEMATOCRIT: 33.5 % — AB (ref 39.0–52.0)
Hemoglobin: 11.6 g/dL — ABNORMAL LOW (ref 13.0–17.0)
MCH: 30.9 pg (ref 26.0–34.0)
MCHC: 34.6 g/dL (ref 30.0–36.0)
MCV: 89.3 fL (ref 78.0–100.0)
Platelets: 159 10*3/uL (ref 150–400)
RBC: 3.75 MIL/uL — AB (ref 4.22–5.81)
RDW: 13.7 % (ref 11.5–15.5)
WBC: 4.3 10*3/uL (ref 4.0–10.5)

## 2014-01-05 LAB — HEPATIC FUNCTION PANEL
ALBUMIN: 4.3 g/dL (ref 3.5–5.2)
ALT: 26 U/L (ref 0–53)
AST: 25 U/L (ref 0–37)
Alkaline Phosphatase: 71 U/L (ref 39–117)
BILIRUBIN DIRECT: 0.1 mg/dL (ref 0.0–0.3)
BILIRUBIN INDIRECT: 0.3 mg/dL (ref 0.2–1.2)
BILIRUBIN TOTAL: 0.4 mg/dL (ref 0.2–1.2)
Total Protein: 7 g/dL (ref 6.0–8.3)

## 2014-01-05 LAB — LIPID PANEL
Cholesterol: 130 mg/dL (ref 0–200)
HDL: 49 mg/dL (ref >39)
LDL CALC: 63 mg/dL (ref 0–99)
Total CHOL/HDL Ratio: 2.7 Ratio
Triglycerides: 89 mg/dL (ref <150)
VLDL: 18 mg/dL (ref 0–40)

## 2014-01-05 LAB — HEMOGLOBIN A1C
Hgb A1c MFr Bld: 6.4 % — ABNORMAL HIGH (ref <5.7)
Mean Plasma Glucose: 137 mg/dL — ABNORMAL HIGH (ref <117)

## 2014-01-05 LAB — TSH: TSH: 1.809 u[IU]/mL (ref 0.350–4.500)

## 2014-01-05 NOTE — Progress Notes (Signed)
Pre visit review using our clinic review tool, if applicable. No additional management support is needed unless otherwise documented below Henry Turner the visit note. 

## 2014-01-05 NOTE — Assessment & Plan Note (Signed)
asymptomatic

## 2014-01-05 NOTE — Progress Notes (Signed)
Patient ID: Henry Turner, male   DOB: [birth year], 73 y.o.   MRN: [id number] Henry Turner [id number] [birth year] 01/05/2014      Progress Note-Follow Up  Subjective  Chief Complaint  Chief Complaint  Patient presents with  . Follow-up    6 month    HPI  Patient is a 73 year old male Henry Turner today for routine medical care.he is Henry Turner today for routine care. He offers no acute complaints. He's had no recent illness. He reports his bowels are moving comfortably no bloody or tarry stool. He stopped his Onglyza about 3 months ago secondary to sugars below 50. His blood sugars have been 80-110 since. No polyuria or polydipsia. Denies CP/palp/SOB/HA/congestion/fevers/GI or GU c/o. Taking meds as prescribed  Past Medical History  Diagnosis Date  . CAD (coronary artery disease)     NSTEMI 2008 with tandem severe mid LAD stenoses treated with Xience V drug eluting stents. Lexiscan myovew Henry Turner 3/11 showed EF 58%, no ischemia or infarction  . Hyperlipidemia   . Diabetes mellitus   . Post-polio syndrome     leg weakness  . HTN (hypertension)   . Anemia 04/12/2013    Past Surgical History  Procedure Laterality Date  . Drug eluting stents      History reviewed. No pertinent family history.  History   Social History  . Marital Status: Single    Spouse Name: N/A    Number of Children: N/A  . Years of Education: N/A   Occupational History  . Not on file.   Social History Main Topics  . Smoking status: Former Smoker    Types: Cigarettes  . Smokeless tobacco: Not on file     Comment: >20 yrs  . Alcohol Use: Not on file  . Drug Use: Not on file  . Sexual Activity: Not on file   Other Topics Concern  . Not on file   Social History Narrative   Lives Henry Turner with family. Retired from [employer]. No family hx of premature CAD.     Current Outpatient Prescriptions on File Prior to Visit  Medication Sig Dispense Refill  . amLODipine (NORVASC) 10 MG tablet TAKE ONE TABLET BY  MOUTH ONCE DAILY  30 tablet  0  . aspirin 81 MG tablet Take 81 mg by mouth daily.        [name] Kitchen. BAYER BREEZE 2 TEST DISK USE ONE TEST STRIP TO TEST BLOOD GLUCOSE ONCE A DAY AND AS NEEDED  100 each  0  . clopidogrel (PLAVIX) 75 MG tablet TAKE ONE TABLET BY MOUTH DAILY  30 tablet  3  . fish oil-omega-3 fatty acids 1000 MG capsule Take 2 capsules (2 g total) by mouth daily.      [name] Kitchen. glipiZIDE (GLUCOTROL) 5 MG tablet TAKE ONE TABLET BY MOUTH ONCE DAILY  30 tablet  3  . simvastatin (ZOCOR) 20 MG tablet TAKE ONE TABLET BY MOUTH ONCE DAILY  30 tablet  3   No current facility-administered medications on file prior to visit.    No Known Allergies  Review of Systems  Review of Systems  Constitutional: Negative for fever and malaise/fatigue.  HENT: Negative for congestion.   Eyes: Negative for discharge.  Respiratory: Negative for shortness of breath.   Cardiovascular: Negative for chest pain, palpitations and leg swelling.  Gastrointestinal: Negative for nausea, abdominal pain and diarrhea.  Genitourinary: Negative for dysuria.  Musculoskeletal: Negative for falls.  Skin: Negative for rash.  Neurological: Negative for loss of  consciousness and headaches.  Endo/Heme/Allergies: Negative for polydipsia.  Psychiatric/Behavioral: Negative for depression and suicidal ideas. The patient is not nervous/anxious and does not have insomnia.     Objective  BP 138/64  Pulse 76  Temp(Src) 98 F (36.7 C) (Oral)  Ht 5\' 5"  (1.651 m)  Wt 137 lb 0.6 oz (62.161 kg)  BMI 22.80 kg/m2  SpO2 98%  Physical Exam  Physical Exam  Constitutional: He is oriented to person, place, and time and well-developed, well-nourished, and Henry Turner no distress. No distress.  HENT:  Head: Normocephalic and atraumatic.  Eyes: Conjunctivae are normal.  Neck: Neck supple. No thyromegaly present.  Cardiovascular: Normal rate, regular rhythm and normal heart sounds.   No murmur heard. Pulmonary/Chest: Effort normal and breath sounds  normal. No respiratory distress.  Abdominal: He exhibits no distension and no mass. There is no tenderness.  Musculoskeletal: He exhibits no edema.  Neurological: He is alert and oriented to person, place, and time.  Skin: Skin is warm.  Psychiatric: Memory, affect and judgment normal.    Lab Results  Component Value Date   TSH 1.677 06/30/2013   Lab Results  Component Value Date   WBC 4.5 07/07/2013   HGB 11.9* 07/07/2013   HCT 35.3* 07/07/2013   MCV 90.7 07/07/2013   PLT 152 07/07/2013   Lab Results  Component Value Date   CREATININE 1.79* 07/07/2013   BUN 27* 07/07/2013   NA 143 07/07/2013   K 4.4 07/07/2013   CL 107 07/07/2013   CO2 27 07/07/2013   Lab Results  Component Value Date   ALT 20 07/07/2013   AST 24 07/07/2013   ALKPHOS 62 07/07/2013   BILITOT 0.6 07/07/2013   Lab Results  Component Value Date   CHOL 126 06/30/2013   Lab Results  Component Value Date   HDL 58 06/30/2013   Lab Results  Component Value Date   LDLCALC 59 06/30/2013   Lab Results  Component Value Date   TRIG 47 06/30/2013   Lab Results  Component Value Date   CHOLHDL 2.2 06/30/2013     Assessment & Plan  HYPERTENSION, UNSPECIFIED Well controlled, no changes to meds. Encouraged heart healthy diet such as the DASH diet and exercise as tolerated.   Diabetes mellitus with chronic kidney disease hgba1c acceptable, minimize simple carbs. Increase exercise as tolerated.stopped Onglyza Henry Turner Henry Turner due to sugars below 50 and has had numbers 80 to 110 since. Does not have an ACE but has some kidney dysfunction. Will check renal today before deciding whether to start Lisinopril  CAD, NATIVE VESSEL asymptomatic  Anemia Henry Turner neg Henry Turner 10/14  Colonoscopy per patient last done roughly 7 years ago Henry Turner Henry Turner patient unable to recall name of practice, does not believe he has any records. He is encouraged to look around at home  Will repeat CBC today  CKD (chronic kidney disease) Repeat renal panel  today  HYPERLIPIDEMIA-MIXED Tolerating statin, encouraged heart healthy diet, avoid trans fats, minimize simple carbs and saturated fats. Increase exercise as tolerated

## 2014-01-05 NOTE — Assessment & Plan Note (Signed)
[name] [place]ael[place] 10/14  Colonoscopy per patient last done roughly [place] years ago [place] unable to recall name of practice, does not believe he has any records. He is encouraged to look around at home  Will repeat CBC today

## 2014-01-05 NOTE — Assessment & Plan Note (Signed)
Well controlled, no changes to meds. Encouraged heart healthy diet such as the DASH diet and exercise as tolerated.  °

## 2014-01-05 NOTE — Assessment & Plan Note (Signed)
Repeat renal panel today 

## 2014-01-05 NOTE — Patient Instructions (Addendum)
Basic Carbohydrate Counting for Diabetes Mellitus Carbohydrate counting is a method for keeping track of the amount of carbohydrates you eat. Eating carbohydrates naturally increases the level of sugar (glucose) [place] your blood, so it is important for you to know the amount that is okay for you to have Jacey every meal. Carbohydrate counting helps keep the level of glucose Huck your blood within normal limits. The amount of carbohydrates allowed is different for every person. A dietitian can help you calculate the amount that is right for you. Once you know the amount of carbohydrates you can have, you can count the carbohydrates Ammaar the foods you want to eat. Carbohydrates are found Ferrell the following foods:  Grains, such as breads and cereals.  Dried beans and soy products.  Starchy vegetables, such as potatoes, peas, and corn.  Fruit and fruit juices.  Milk and yogurt.  Sweets and snack foods, such as cake, cookies, candy, chips, soft drinks, and fruit drinks. CARBOHYDRATE COUNTING There are two ways to count the carbohydrates Chanler your food. You can use either of the methods or a combination of both. Reading the "Nutrition Facts" on Packaged Food The "Nutrition Facts" is an area that is included on the labels of almost all packaged food and beverages [place] the [place]. It includes the serving size of that food or beverage and information about the nutrients Arwin each serving of the food, including the grams (g) of carbohydrate per serving.  Decide the number of servings of this food or beverage that you will be able to eat or drink. Multiply that number of servings by the number of grams of carbohydrate that is listed on the label for that serving. The total will be the amount of carbohydrates you will be having when you eat or drink this food or beverage. Learning Standard Serving Sizes of Food When you eat food that is not packaged or does not include "Nutrition Facts" on the label, you need to  measure the servings Mehar order to count the amount of carbohydrates.A serving of most carbohydrate-rich foods contains about 15 g of carbohydrates. The following list includes serving sizes of carbohydrate-rich foods that provide 15 g ofcarbohydrate per serving:   1 slice of bread (1 oz) or 1 six-inch tortilla.    of a hamburger bun or English muffin.  4-6 crackers.   cup unsweetened dry cereal.    cup hot cereal.   cup rice or pasta.    cup mashed potatoes or  of a large baked potato.  1 cup fresh fruit or one small piece of fruit.    cup canned or frozen fruit or fruit juice.  1 cup milk.   cup plain fat-free yogurt or yogurt sweetened with artificial sweeteners.   cup cooked dried beans or starchy vegetable, such as peas, corn, or potatoes.  Decide the number of standard-size servings that you will eat. Multiply that number of servings by 15 (the grams of carbohydrates Marquies that serving). For example, if you eat 2 cups of strawberries, you will have eaten 2 servings and 30 g of carbohydrates (2 servings x 15 g = 30 g). For foods such as soups and casseroles, Sol which more than one food is mixed Jona, you will need to count the carbohydrates Timon each food that is included. EXAMPLE OF CARBOHYDRATE COUNTING Sample Dinner  3 oz chicken breast.   cup of brown rice.   cup of corn.  1 cup milk.   1 cup strawberries with   sugar-free whipped topping.  Carbohydrate Calculation Step 1: Identify the foods that contain carbohydrates:   Rice.   Corn.   Milk.   Strawberries. Step 2:Calculate the number of servings eaten of each:   2 servings of rice.   1 serving of corn.   1 serving of milk.   1 serving of strawberries. Step 3: Multiply each of those number of servings by 15 g:   2 servings of rice x 15 g = 30 g.   1 serving of corn x 15 g = 15 g.   1 serving of milk x 15 g = 15 g.   1 serving of strawberries x 15 g = 15 g. Step 4: Add  together all of the amounts to find the total grams of carbohydrates eaten: 30 g + 15 g + 15 g + 15 g = 75 g. Document Released: 06/19/2005 Document Revised: 06/24/2013 Document Reviewed: 05/16/2013 Exit[employer] Patient Information 2015 [employer]. This information is not intended to replace advice given to you by your health care provider. Make sure you discuss any questions you have with your health care provider.  

## 2014-01-05 NOTE — Assessment & Plan Note (Signed)
Tolerating statin, encouraged heart healthy diet, avoid trans fats, minimize simple carbs and saturated fats. Increase exercise as tolerated 

## 2014-01-05 NOTE — Assessment & Plan Note (Signed)
hgba1c acceptable, minimize simple carbs. Increase exercise as tolerated.stopped Onglyza D[place] April due to sugars below 50 and has had numbers 80 to 110 since. Does not have an ACE but has some kidney dysfunction. Will check renal today before deciding whether to start Lisinopril

## 2014-01-06 ENCOUNTER — Telehealth: Payer: Self-pay

## 2014-01-06 ENCOUNTER — Telehealth: Payer: Self-pay | Admitting: Family Medicine

## 2014-01-06 DIAGNOSIS — R7989 Other specified abnormal findings of blood chemistry: Secondary | ICD-10-CM

## 2014-01-06 LAB — MICROALBUMIN / CREATININE URINE RATIO
Creatinine, Urine: 59.7 mg/dL
MICROALB UR: 25.66 mg/dL — AB (ref 0.00–1.89)
Microalb Creat Ratio: 429.8 mg/g — ABNORMAL HIGH (ref 0.0–30.0)

## 2014-01-06 MED ORDER — LISINOPRIL 2.5 MG PO TABS: 2.5000 mg | ORAL_TABLET | Freq: Every day | ORAL | Status: DC

## 2014-01-06 NOTE — Telephone Encounter (Signed)
Lab order placed per md's note from lab results

## 2014-01-06 NOTE — Addendum Note (Signed)
Addended by: Court JoyFREEMAN, Holly Iannaccone L on: 01/06/2014 02:59 PM   Modules accepted: Orders

## 2014-01-06 NOTE — Telephone Encounter (Signed)
Relevant patient education mailed to patient.  

## 2014-01-08 ENCOUNTER — Other Ambulatory Visit: Payer: Self-pay | Admitting: Family Medicine

## 2014-02-06 ENCOUNTER — Other Ambulatory Visit: Payer: Self-pay | Admitting: Family Medicine

## 2014-02-28 ENCOUNTER — Other Ambulatory Visit: Payer: Self-pay | Admitting: Family Medicine

## 2014-03-04 ENCOUNTER — Telehealth: Payer: Self-pay

## 2014-03-04 MED ORDER — GLUCOSE BLOOD VI DISK: DISK | Status: DC

## 2014-03-04 NOTE — Telephone Encounter (Signed)
RX needs DX code for [employer] 2 test disk  New RX sent

## 2014-03-10 ENCOUNTER — Other Ambulatory Visit: Payer: Self-pay | Admitting: Family Medicine

## 2014-04-07 ENCOUNTER — Other Ambulatory Visit: Payer: Self-pay | Admitting: Family Medicine

## 2014-05-07 ENCOUNTER — Other Ambulatory Visit: Payer: Self-pay | Admitting: Family Medicine

## 2014-05-08 ENCOUNTER — Other Ambulatory Visit: Payer: Self-pay | Admitting: *Deleted

## 2014-05-08 MED ORDER — CLOPIDOGREL BISULFATE 75 MG PO TABS: ORAL_TABLET | ORAL | Status: DC

## 2014-05-08 MED ORDER — SIMVASTATIN 20 MG PO TABS: ORAL_TABLET | ORAL | Status: DC

## 2014-05-08 MED ORDER — LISINOPRIL 2.5 MG PO TABS: ORAL_TABLET | ORAL | Status: DC

## 2014-05-08 NOTE — Telephone Encounter (Signed)
Rx's sent to the pharmacy by e-script.//AB/CMA 

## 2014-05-13 DIAGNOSIS — M24673 Ankylosis, unspecified ankle: Secondary | ICD-10-CM | POA: Diagnosis not present

## 2014-05-13 DIAGNOSIS — R937 Abnormal findings on diagnostic imaging of other parts of musculoskeletal system: Secondary | ICD-10-CM | POA: Diagnosis not present

## 2014-05-13 DIAGNOSIS — Z981 Arthrodesis status: Secondary | ICD-10-CM | POA: Diagnosis not present

## 2014-05-14 ENCOUNTER — Other Ambulatory Visit: Payer: Self-pay

## 2014-05-14 MED ORDER — LISINOPRIL 2.5 MG PO TABS: ORAL_TABLET | ORAL | Status: DC

## 2014-05-14 MED ORDER — SIMVASTATIN 20 MG PO TABS: ORAL_TABLET | ORAL | Status: DC

## 2014-05-14 MED ORDER — CLOPIDOGREL BISULFATE 75 MG PO TABS: ORAL_TABLET | ORAL | Status: DC

## 2014-05-23 DIAGNOSIS — Z96661 Presence of right artificial ankle joint: Secondary | ICD-10-CM | POA: Diagnosis not present

## 2014-05-23 DIAGNOSIS — R600 Localized edema: Secondary | ICD-10-CM | POA: Diagnosis not present

## 2014-05-23 DIAGNOSIS — R601 Generalized edema: Secondary | ICD-10-CM | POA: Diagnosis not present

## 2014-05-23 DIAGNOSIS — Z9889 Other specified postprocedural states: Secondary | ICD-10-CM | POA: Diagnosis not present

## 2014-05-23 DIAGNOSIS — M6250 Muscle wasting and atrophy, not elsewhere classified, unspecified site: Secondary | ICD-10-CM | POA: Diagnosis not present

## 2014-06-10 ENCOUNTER — Other Ambulatory Visit: Payer: Self-pay | Admitting: Family Medicine

## 2014-06-29 DIAGNOSIS — H40023 Open angle with borderline findings, high risk, bilateral: Secondary | ICD-10-CM | POA: Diagnosis not present

## 2014-06-29 DIAGNOSIS — H2513 Age-related nuclear cataract, bilateral: Secondary | ICD-10-CM | POA: Diagnosis not present

## 2014-06-29 DIAGNOSIS — H5203 Hypermetropia, bilateral: Secondary | ICD-10-CM | POA: Diagnosis not present

## 2014-06-29 LAB — HM DIABETES EYE EXAM

## 2014-06-30 ENCOUNTER — Ambulatory Visit (INDEPENDENT_AMBULATORY_CARE_PROVIDER_SITE_OTHER): Payer: Medicare Other | Admitting: Family Medicine

## 2014-06-30 ENCOUNTER — Encounter: Payer: Self-pay | Admitting: Family Medicine

## 2014-06-30 VITALS — BP 128/64 | HR 72 | Temp 97.8°F | Ht 64.0 in | Wt 143.4 lb

## 2014-06-30 DIAGNOSIS — I10 Essential (primary) hypertension: Secondary | ICD-10-CM | POA: Diagnosis not present

## 2014-06-30 DIAGNOSIS — E119 Type 2 diabetes mellitus without complications: Secondary | ICD-10-CM | POA: Diagnosis not present

## 2014-06-30 DIAGNOSIS — E782 Mixed hyperlipidemia: Secondary | ICD-10-CM | POA: Diagnosis not present

## 2014-06-30 DIAGNOSIS — I2510 Atherosclerotic heart disease of native coronary artery without angina pectoris: Secondary | ICD-10-CM

## 2014-06-30 DIAGNOSIS — E1122 Type 2 diabetes mellitus with diabetic chronic kidney disease: Secondary | ICD-10-CM | POA: Diagnosis not present

## 2014-06-30 DIAGNOSIS — Z0000 Encounter for general adult medical examination without abnormal findings: Secondary | ICD-10-CM | POA: Diagnosis not present

## 2014-06-30 DIAGNOSIS — N189 Chronic kidney disease, unspecified: Secondary | ICD-10-CM

## 2014-06-30 DIAGNOSIS — D649 Anemia, unspecified: Secondary | ICD-10-CM

## 2014-06-30 LAB — COMPLETE METABOLIC PANEL WITH GFR
ALT: 15 U/L (ref 0–53)
AST: 23 U/L (ref 0–37)
Albumin: 4.4 g/dL (ref 3.5–5.2)
Alkaline Phosphatase: 73 U/L (ref 39–117)
BUN: 34 mg/dL — AB (ref 6–23)
CHLORIDE: 110 meq/L (ref 96–112)
CO2: 21 mEq/L (ref 19–32)
CREATININE: 1.99 mg/dL — AB (ref 0.50–1.35)
Calcium: 9.4 mg/dL (ref 8.4–10.5)
GFR, EST AFRICAN AMERICAN: 37 mL/min — AB
GFR, Est Non African American: 32 mL/min — ABNORMAL LOW
Glucose, Bld: 92 mg/dL (ref 70–99)
Potassium: 5.1 mEq/L (ref 3.5–5.3)
Sodium: 141 mEq/L (ref 135–145)
Total Bilirubin: 0.5 mg/dL (ref 0.2–1.2)
Total Protein: 7.5 g/dL (ref 6.0–8.3)

## 2014-06-30 LAB — LIPID PANEL
CHOLESTEROL: 127 mg/dL (ref 0–200)
HDL: 51.4 mg/dL (ref >39.00)
LDL Cholesterol: 65 mg/dL (ref 0–99)
NonHDL: 75.6
Total CHOL/HDL Ratio: 2
Triglycerides: 55 mg/dL (ref 0.0–149.0)
VLDL: 11 mg/dL (ref 0.0–40.0)

## 2014-06-30 LAB — HEMOGLOBIN A1C: HEMOGLOBIN A1C: 6.7 % — AB (ref 4.6–6.5)

## 2014-06-30 LAB — TSH: TSH: 1.14 u[IU]/mL (ref 0.35–4.50)

## 2014-06-30 NOTE — Progress Notes (Signed)
Pre visit review using our clinic review tool, if applicable. No additional management support is needed unless otherwise documented below Male the visit note. 

## 2014-06-30 NOTE — Patient Instructions (Signed)
Preventive Care for Adults A healthy lifestyle and preventive care can promote health and wellness. Preventive health guidelines for men include the following key practices:  A routine yearly physical is a good way to check with your health care provider about your health and preventative screening. It is a chance to share any concerns and updates on your health and to receive a thorough exam.  Visit your dentist for a routine exam and preventative care every 6 months. Brush your teeth twice a day and floss once a day. Good oral hygiene prevents tooth decay and gum disease.  The frequency of eye exams is based on your age, health, family medical history, use of contact lenses, and other factors. Follow your health care provider's recommendations for frequency of eye exams.  Eat a healthy diet. Foods such as vegetables, fruits, whole grains, low-fat dairy products, and lean protein foods contain the nutrients you need without too many calories. Decrease your intake of foods high Drewey solid fats, added sugars, and salt. Eat the right amount of calories for you.Get information about a proper diet from your health care provider, if necessary.  Regular physical exercise is one of the most important things you can do for your health. Most adults should get at least 150 minutes of moderate-intensity exercise (any activity that increases your heart rate and causes you to sweat) each week. Nickson addition, most adults need muscle-strengthening exercises on 2 or more days a week.  Maintain a healthy weight. The body mass index (BMI) is a screening tool to identify possible weight problems. It provides an estimate of body fat based on height and weight. Your health care provider can find your BMI and can help you achieve or maintain a healthy weight.For adults 20 years and older:  A BMI below 18.5 is considered underweight.  A BMI of 18.5 to 24.9 is normal.  A BMI of 25 to 29.9 is considered overweight.  A BMI  of 30 and above is considered obese.  Maintain normal blood lipids and cholesterol levels by exercising and minimizing your intake of saturated fat. Eat a balanced diet with plenty of fruit and vegetables. Blood tests for lipids and cholesterol should begin at age 50 and be repeated every 5 years. If your lipid or cholesterol levels are high, you are over 50, or you are at high risk for heart disease, you may need your cholesterol levels checked more frequently.Ongoing high lipid and cholesterol levels should be treated with medicines if diet and exercise are not working.  If you smoke, find out from your health care provider how to quit. If you do not use tobacco, do not start.  Lung cancer screening is recommended for adults aged 73-80 years who are at high risk for developing lung cancer because of a history of smoking. A yearly low-dose CT scan of the lungs is recommended for people who have at least a 30-pack-year history of smoking and are a current smoker or have quit within the past 15 years. A pack year of smoking is smoking an average of 1 pack of cigarettes a day for 1 year (for example: 1 pack a day for 30 years or 2 packs a day for 15 years). Yearly screening should continue until the smoker has stopped smoking for at least 15 years. Yearly screening should be stopped for people who develop a health problem that would prevent them from having lung cancer treatment.  If you choose to drink alcohol, do not have more than  2 drinks per day. One drink is considered to be 12 ounces (355 mL) of beer, 5 ounces (148 mL) of wine, or 1.5 ounces (44 mL) of liquor.  Avoid use of street drugs. Do not share needles with anyone. Ask for help if you need support or instructions about stopping the use of drugs.  High blood pressure causes heart disease and increases the risk of stroke. Your blood pressure should be checked at least every 1-2 years. Ongoing high blood pressure should be treated with  medicines, if weight loss and exercise are not effective.  If you are 45-79 years old, ask your health care provider if you should take aspirin to prevent heart disease.  Diabetes screening involves taking a blood sample to check your fasting blood sugar level. This should be done once every 3 years, after age 45, if you are within normal weight and without risk factors for diabetes. Testing should be considered at a younger age or be carried out more frequently if you are overweight and have at least 1 risk factor for diabetes.  Colorectal cancer can be detected and often prevented. Most routine colorectal cancer screening begins at the age of 50 and continues through age 75. However, your health care provider may recommend screening at an earlier age if you have risk factors for colon cancer. On a yearly basis, your health care provider may provide home test kits to check for hidden blood Alby the stool. Use of a small camera at the end of a tube to directly examine the colon (sigmoidoscopy or colonoscopy) can detect the earliest forms of colorectal cancer. Talk to your health care provider about this at age 50, when routine screening begins. Direct exam of the colon should be repeated every 5-10 years through age 75, unless early forms of precancerous polyps or small growths are found.  People who are at an increased risk for hepatitis B should be screened for this virus. You are considered at high risk for hepatitis B if:  You were born [place] a country where hepatitis B occurs often. Talk with your health care provider about which countries are considered high risk.  Your parents were born [place] a high-risk country and you have not received a shot to protect against hepatitis B (hepatitis B vaccine).  You have HIV or AIDS.  You use needles to inject street drugs.  You live with, or have sex with, someone who has hepatitis B.  You are a man who has sex with other men (MSM).  You get hemodialysis  treatment.  You take certain medicines for conditions such as cancer, organ transplantation, and autoimmune conditions.  Hepatitis C blood testing is recommended for all people born from 1945 through 1965 and any individual with known risks for hepatitis C.  Practice safe sex. Use condoms and avoid high-risk sexual practices to reduce the spread of sexually transmitted infections (STIs). STIs include gonorrhea, chlamydia, syphilis, trichomonas, herpes, HPV, and human immunodeficiency virus (HIV). Herpes, HIV, and HPV are viral illnesses that have no cure. They can result Feras disability, cancer, and death.  If you are at risk of being infected with HIV, it is recommended that you take a prescription medicine daily to prevent HIV infection. This is called preexposure prophylaxis (PrEP). You are considered at risk if:  You are a man who has sex with other men (MSM) and have other risk factors.  You are a heterosexual man, are sexually active, and are at increased risk for HIV infection.    You take drugs by injection.  You are sexually active with a partner who has HIV.  Talk with your health care provider about whether you are at high risk of being infected with HIV. If you choose to begin PrEP, you should first be tested for HIV. You should then be tested every 3 months for as long as you are taking PrEP.  A one-time screening for abdominal aortic aneurysm (AAA) and surgical repair of large AAAs by ultrasound are recommended for men ages 32 to 67 years who are current or former smokers.  Healthy men should no longer receive prostate-specific antigen (PSA) blood tests as part of routine cancer screening. Talk with your health care provider about prostate cancer screening.  Testicular cancer screening is not recommended for adult males who have no symptoms. Screening includes self-exam, a health care provider exam, and other screening tests. Consult with your health care provider about any symptoms  you have or any concerns you have about testicular cancer.  Use sunscreen. Apply sunscreen liberally and repeatedly throughout the day. You should seek shade when your shadow is shorter than you. Protect yourself by wearing long sleeves, pants, a wide-brimmed hat, and sunglasses year round, whenever you are outdoors.  Once a month, do a whole-body skin exam, using a mirror to look at the skin on your back. Tell your health care provider about new moles, moles that have irregular borders, moles that are larger than a pencil eraser, or moles that have changed Mccormick shape or color.  Stay current with required vaccines (immunizations).  Influenza vaccine. All adults should be immunized every year.  Tetanus, diphtheria, and acellular pertussis (Td, Tdap) vaccine. An adult who has not previously received Tdap or who does not know his vaccine status should receive 1 dose of Tdap. This initial dose should be followed by tetanus and diphtheria toxoids (Td) booster doses every 10 years. Adults with an unknown or incomplete history of completing a 3-dose immunization series with Td-containing vaccines should begin or complete a primary immunization series including a Tdap dose. Adults should receive a Td booster every 10 years.  Varicella vaccine. An adult without evidence of immunity to varicella should receive 2 doses or a second dose if he has previously received 1 dose.  Human papillomavirus (HPV) vaccine. Males aged 68-21 years who have not received the vaccine previously should receive the 3-dose series. Males aged 22-26 years may be immunized. Immunization is recommended through the age of 6 years for any male who has sex with males and did not get any or all doses earlier. Immunization is recommended for any person with an immunocompromised condition through the age of 49 years if he did not get any or all doses earlier. During the 3-dose series, the second dose should be obtained 4-8 weeks after the first  dose. The third dose should be obtained 24 weeks after the first dose and 16 weeks after the second dose.  Zoster vaccine. One dose is recommended for adults aged 50 years or older unless certain conditions are present.  Measles, mumps, and rubella (MMR) vaccine. Adults born before [birth year] generally are considered immune to measles and mumps. Adults born Isamar [birth year] or later should have 1 or more doses of MMR vaccine unless there is a contraindication to the vaccine or there is laboratory evidence of immunity to each of the three diseases. A routine second dose of MMR vaccine should be obtained at least 28 days after the first dose for students attending postsecondary  schools, health care workers, or international travelers. People who received inactivated measles vaccine or an unknown type of measles vaccine during 1963-1967 should receive 2 doses of MMR vaccine. People who received inactivated mumps vaccine or an unknown type of mumps vaccine before 1979 and are at high risk for mumps infection should consider immunization with 2 doses of MMR vaccine. Unvaccinated health care workers born before 1957 who lack laboratory evidence of measles, mumps, or rubella immunity or laboratory confirmation of disease should consider measles and mumps immunization with 2 doses of MMR vaccine or rubella immunization with 1 dose of MMR vaccine.  Pneumococcal 13-valent conjugate (PCV13) vaccine. When indicated, a person who is uncertain of his immunization history and has no record of immunization should receive the PCV13 vaccine. An adult aged 19 years or older who has certain medical conditions and has not been previously immunized should receive 1 dose of PCV13 vaccine. This PCV13 should be followed with a dose of pneumococcal polysaccharide (PPSV23) vaccine. The PPSV23 vaccine dose should be obtained at least 8 weeks after the dose of PCV13 vaccine. An adult aged 19 years or older who has certain medical conditions and  previously received 1 or more doses of PPSV23 vaccine should receive 1 dose of PCV13. The PCV13 vaccine dose should be obtained 1 or more years after the last PPSV23 vaccine dose.  Pneumococcal polysaccharide (PPSV23) vaccine. When PCV13 is also indicated, PCV13 should be obtained first. All adults aged 65 years and older should be immunized. An adult younger than age 65 years who has certain medical conditions should be immunized. Any person who resides Tab a nursing home or long-term care facility should be immunized. An adult smoker should be immunized. People with an immunocompromised condition and certain other conditions should receive both PCV13 and PPSV23 vaccines. People with human immunodeficiency virus (HIV) infection should be immunized as soon as possible after diagnosis. Immunization during chemotherapy or radiation therapy should be avoided. Routine use of PPSV23 vaccine is not recommended for American Indians, [place] Natives, or people younger than 65 years unless there are medical conditions that require PPSV23 vaccine. When indicated, people who have unknown immunization and have no record of immunization should receive PPSV23 vaccine. One-time revaccination 5 years after the first dose of PPSV23 is recommended for people aged 19-64 years who have chronic kidney failure, nephrotic syndrome, asplenia, or immunocompromised conditions. People who received 1-2 doses of PPSV23 before age 65 years should receive another dose of PPSV23 vaccine at age 65 years or later if at least 5 years have passed since the previous dose. Doses of PPSV23 are not needed for people immunized with PPSV23 at or after age 65 years.  Meningococcal vaccine. Adults with asplenia or persistent complement component deficiencies should receive 2 doses of quadrivalent meningococcal conjugate (MenACWY-D) vaccine. The doses should be obtained at least 2 months apart. Microbiologists working with certain meningococcal bacteria,  military recruits, people at risk during an outbreak, and people who travel to or live [place] countries with a high rate of meningitis should be immunized. A first-year college student up through age 21 years who is living Arinze a residence hall should receive a dose if he did not receive a dose on or after his 16th birthday. Adults who have certain high-risk conditions should receive one or more doses of vaccine.  Hepatitis A vaccine. Adults who wish to be protected from this disease, have certain high-risk conditions, work with hepatitis A-infected animals, work Conrad hepatitis A research labs, or   travel to or work Jordie countries with a high rate of hepatitis A should be immunized. Adults who were previously unvaccinated and who anticipate close contact with an international adoptee during the first 60 days after arrival [place] the [place] from a country with a high rate of hepatitis A should be immunized.  Hepatitis B vaccine. Adults should be immunized if they wish to be protected from this disease, have certain high-risk conditions, may be exposed to blood or other infectious body fluids, are household contacts or sex partners of hepatitis B positive people, are clients or workers Bradan certain care facilities, or travel to or work Leonidus countries with a high rate of hepatitis B.  Haemophilus influenzae type b (Hib) vaccine. A previously unvaccinated person with asplenia or sickle cell disease or having a scheduled splenectomy should receive 1 dose of Hib vaccine. Regardless of previous immunization, a recipient of a hematopoietic stem cell transplant should receive a 3-dose series 6-12 months after his successful transplant. Hib vaccine is not recommended for adults with HIV infection. Preventive Service / Frequency Ages 52 to 17  Blood pressure check.** / Every 1 to 2 years.  Lipid and cholesterol check.** / Every 5 years beginning at age 69.  Hepatitis C blood test.** / For any individual with known risks for  hepatitis C.  Skin self-exam. / Monthly.  Influenza vaccine. / Every year.  Tetanus, diphtheria, and acellular pertussis (Tdap, Td) vaccine.** / Consult your health care provider. 1 dose of Td every 10 years.  Varicella vaccine.** / Consult your health care provider.  HPV vaccine. / 3 doses over 6 months, if 72 or younger.  Measles, mumps, rubella (MMR) vaccine.** / You need at least 1 dose of MMR if you were born Yuniel 1957 or later. You may also need a second dose.  Pneumococcal 13-valent conjugate (PCV13) vaccine.** / Consult your health care provider.  Pneumococcal polysaccharide (PPSV23) vaccine.** / 1 to 2 doses if you smoke cigarettes or if you have certain conditions.  Meningococcal vaccine.** / 1 dose if you are age 35 to 60 years and a Market researcher living Gian[place]co a residence hall, or have one of several medical conditions. You may also need additional booster doses.  Hepatitis A vaccine.** / Consult your health care provider.  Hepatitis B vaccine.** / Consult your health care provider.  Haemophilus influenzae type b (Hib) vaccine.** / Consult your health care provider. Ages 35 to 8  Blood pressure check.** / Every 1 to 2 years.  Lipid and cholesterol check.** / Every 5 years beginning at age 57.  Lung cancer screening. / Every year if you are aged 44-80 years and have a 30-pack-year history of smoking and currently smoke or have quit within the past 15 years. Yearly screening is stopped once you have quit smoking for at least 15 years or develop a health problem that would prevent you from having lung cancer treatment.  Fecal occult blood test (FOBT) of stool. / Every year beginning at age 55 and continuing until age 73. You may not have to do this test if you get a colonoscopy every 10 years.  Flexible sigmoidoscopy** or colonoscopy.** / Every 5 years for a flexible sigmoidoscopy or every 10 years for a colonoscopy beginning at age 28 and continuing until age  1.  Hepatitis C blood test.** / For all people born from [birth year] through 1965 and any individual with known risks for hepatitis C.  Skin self-exam. / Monthly.  Influenza vaccine. / Every  year.  Tetanus, diphtheria, and acellular pertussis (Tdap/Td) vaccine.** / Consult your health care provider. 1 dose of Td every 10 years.  Varicella vaccine.** / Consult your health care provider.  Zoster vaccine.** / 1 dose for adults aged 53 years or older.  Measles, mumps, rubella (MMR) vaccine.** / You need at least 1 dose of MMR if you were born Willam 1957 or later. You may also need a second dose.  Pneumococcal 13-valent conjugate (PCV13) vaccine.** / Consult your health care provider.  Pneumococcal polysaccharide (PPSV23) vaccine.** / 1 to 2 doses if you smoke cigarettes or if you have certain conditions.  Meningococcal vaccine.** / Consult your health care provider.  Hepatitis A vaccine.** / Consult your health care provider.  Hepatitis B vaccine.** / Consult your health care provider.  Haemophilus influenzae type b (Hib) vaccine.** / Consult your health care provider. Ages 77 and over  Blood pressure check.** / Every 1 to 2 years.  Lipid and cholesterol check.**/ Every 5 years beginning at age 85.  Lung cancer screening. / Every year if you are aged 55-80 years and have a 30-pack-year history of smoking and currently smoke or have quit within the past 15 years. Yearly screening is stopped once you have quit smoking for at least 15 years or develop a health problem that would prevent you from having lung cancer treatment.  Fecal occult blood test (FOBT) of stool. / Every year beginning at age 33 and continuing until age 11. You may not have to do this test if you get a colonoscopy every 10 years.  Flexible sigmoidoscopy** or colonoscopy.** / Every 5 years for a flexible sigmoidoscopy or every 10 years for a colonoscopy beginning at age 28 and continuing until age 73.  Hepatitis C blood  test.** / For all people born from [birth year] through 1965 and any individual with known risks for hepatitis C.  Abdominal aortic aneurysm (AAA) screening.** / A one-time screening for ages 50 to 27 years who are current or former smokers.  Skin self-exam. / Monthly.  Influenza vaccine. / Every year.  Tetanus, diphtheria, and acellular pertussis (Tdap/Td) vaccine.** / 1 dose of Td every 10 years.  Varicella vaccine.** / Consult your health care provider.  Zoster vaccine.** / 1 dose for adults aged 34 years or older.  Pneumococcal 13-valent conjugate (PCV13) vaccine.** / Consult your health care provider.  Pneumococcal polysaccharide (PPSV23) vaccine.** / 1 dose for all adults aged 63 years and older.  Meningococcal vaccine.** / Consult your health care provider.  Hepatitis A vaccine.** / Consult your health care provider.  Hepatitis B vaccine.** / Consult your health care provider.  Haemophilus influenzae type b (Hib) vaccine.** / Consult your health care provider. **Family history and personal history of risk and conditions may change your health care provider's recommendations. Document Released: 08/15/2001 Document Revised: 06/24/2013 Document Reviewed: 11/14/2010 [facility] Patient Information 2015 [place], [place]. This information is not intended to replace advice given to you by your health care provider. Make sure you discuss any questions you have with your health care provider.

## 2014-07-06 ENCOUNTER — Other Ambulatory Visit: Payer: Self-pay | Admitting: Family Medicine

## 2014-07-07 NOTE — Telephone Encounter (Signed)
Rx's sent to the pharmacy by e-script.//AB/CMA 

## 2014-07-08 DIAGNOSIS — Z0000 Encounter for general adult medical examination without abnormal findings: Secondary | ICD-10-CM | POA: Insufficient documentation

## 2014-07-08 NOTE — Progress Notes (Signed)
Henry Turner  [id number] 1941-02-25 07/08/2014      Progress Note-Follow Up  Subjective  Chief Complaint  Chief Complaint  Patient presents with  . Medicare Wellness    HPI  Patient is a 74 y.o. male Henry Turner today for routine medical care. He is Henry Turner today for follow-up and medication wellness visit. He is feeling well. He has recently returned from a trip to [place]. He received his flu shot on 04/02/2014 [facility] [place]. He had his annual eye exam on 06/29/2014. No recent illness. He's had a high blood sugar of 120 and a little of 66 today Henry Turner the last month. Denies polyuria or polydipsia. No recent illness. Denies CP/palp/SOB/HA/congestion/fevers/GI or GU c/o. Taking meds as prescribed  Past Medical History  Diagnosis Date  . CAD (coronary artery disease)     NSTEMI 2008 with tandem severe mid LAD stenoses treated with Xience V drug eluting stents. Lexiscan myovew Henry Turner 3/11 showed EF 58%, no ischemia or infarction  . Hyperlipidemia   . Diabetes mellitus   . Post-polio syndrome     leg weakness  . HTN (hypertension)   . Anemia 04/12/2013    Past Surgical History  Procedure Laterality Date  . Drug eluting stents      History reviewed. No pertinent family history.  History   Social History  . Marital Status: Single    Spouse Name: N/A    Number of Children: N/A  . Years of Education: N/A   Occupational History  . Not on file.   Social History Main Topics  . Smoking status: Former Smoker    Types: Cigarettes  . Smokeless tobacco: Not on file     Comment: >20 yrs  . Alcohol Use: Not on file  . Drug Use: Not on file  . Sexual Activity: Not on file   Other Topics Concern  . Not on file   Social History Narrative   [place]. Retired from [employer]. No family hx of premature CAD.     Current Outpatient Prescriptions on File Prior to Visit  Medication Sig Dispense Refill  . aspirin 81 MG tablet Take 81 mg by mouth daily.      [name] Kitchen.  BAYER BREEZE 2 TEST DISK USE ONE STRIP TO CHECK GLUCOSE ONCE DAILY AS NEEDED 100 each 0  . clopidogrel (PLAVIX) 75 MG tablet TAKE ONE TABLET BY MOUTH DAILY 30 tablet 0  . fish oil-omega-3 fatty acids 1000 MG capsule Take 2 capsules (2 g total) by mouth daily.    [name] Kitchen. lisinopril (PRINIVIL,ZESTRIL) 2.5 MG tablet TAKE ONE TABLET BY MOUTH ONCE DAILY 30 tablet 0  . simvastatin (ZOCOR) 20 MG tablet TAKE ONE TABLET BY MOUTH ONCE DAILY 30 tablet 0   No current facility-administered medications on file prior to visit.    No Known Allergies  Review of Systems  Review of Systems  Constitutional: Negative for fever, chills and malaise/fatigue.  HENT: Negative for congestion, hearing loss and nosebleeds.   Eyes: Negative for discharge.  Respiratory: Negative for cough, sputum production, shortness of breath and wheezing.   Cardiovascular: Negative for chest pain, palpitations and leg swelling.  Gastrointestinal: Negative for heartburn, nausea, vomiting, abdominal pain, diarrhea, constipation and blood Norman stool.  Genitourinary: Negative for dysuria, urgency, frequency and hematuria.  Musculoskeletal: Negative for myalgias, back pain and falls.  Skin: Negative for rash.  Neurological: Negative for dizziness, tremors, sensory change, focal weakness, loss of consciousness, weakness and headaches.  Endo/Heme/Allergies:  Negative for polydipsia. Does not bruise/bleed easily.  Psychiatric/Behavioral: Negative for depression and suicidal ideas. The patient is not nervous/anxious and does not have insomnia.     Objective  BP 128/64 mmHg  Pulse 72  Temp(Src) 97.8 F (36.6 C) (Oral)  Ht  (1.626 m)  Wt 143 lb 6.4 oz (65.046 kg)  BMI 24.60 kg/m2  SpO2 100%  Physical Exam  Physical Exam  Constitutional: He is oriented to person, place, and time and well-developed, well-nourished, and [facility] no distress. No distress.  HENT:  Head: Normocephalic and atraumatic.  Eyes: Conjunctivae are normal.  Neck:  Neck supple. No thyromegaly present.  Cardiovascular: Normal rate, regular rhythm and normal heart sounds.   No murmur heard. Pulmonary/Chest: Effort normal and breath sounds normal. No respiratory distress.  Abdominal: He exhibits no distension and no mass. There is no tenderness.  Musculoskeletal: He exhibits no edema.  Neurological: He is alert and oriented to person, place, and time.  Skin: Skin is warm.  Psychiatric: Memory, affect and judgment normal.    Lab Results  Component Value Date   TSH 1.14 06/30/2014   Lab Results  Component Value Date   WBC 4.3 01/05/2014   HGB 11.6* 01/05/2014   HCT 33.5* 01/05/2014   MCV 89.3 01/05/2014   PLT 159 01/05/2014   Lab Results  Component Value Date   CREATININE 1.99* 06/30/2014   BUN 34* 06/30/2014   NA 141 06/30/2014   K 5.1 06/30/2014   CL 110 06/30/2014   CO2 21 06/30/2014   Lab Results  Component Value Date   ALT 15 06/30/2014   AST 23 06/30/2014   ALKPHOS 73 06/30/2014   BILITOT 0.5 06/30/2014   Lab Results  Component Value Date   CHOL 127 06/30/2014   Lab Results  Component Value Date   HDL 51.40 06/30/2014   Lab Results  Component Value Date   LDLCALC 65 06/30/2014   Lab Results  Component Value Date   TRIG 55.0 06/30/2014   Lab Results  Component Value Date   CHOLHDL 2 06/30/2014     Assessment & Plan  Essential hypertension Well controlled, no changes to meds. Encouraged heart healthy diet such as the DASH diet and exercise as tolerated.   Diabetes mellitus with chronic kidney disease hgba1c acceptable, minimize simple carbs. Increase exercise as tolerated. Continue current meds  Hyperlipidemia, mixed Tolerating statin, encouraged heart healthy diet, avoid trans fats, minimize simple carbs and saturated fats. Increase exercise as tolerated  Anemia Proceed with IFOB. Increase leafy greens, consider increased lean red meat and using cast iron cookware. Continue to monitor, report any  concerns  CKD (chronic kidney disease) Creatinine has incresaed slightly. Increase hydration and recheck.   Medicare annual wellness visit, subsequent Patient denies any difficulties at home. No trouble with ADLs, depression or falls. No recent changes to vision or hearing. Is UTD with immunizations. Is UTD with screening. Discussed Advanced Directives, patient agrees to bring [place] copies of documents if can. Encouraged heart healthy diet, exercise as tolerated and adequate sleep. Sees [facility], sees Dr Shirlee Latch of cardiology. Reviewed labwork. Return [place] 6 months or as needed

## 2014-07-08 NOTE — Assessment & Plan Note (Signed)
hgba1c acceptable, minimize simple carbs. Increase exercise as tolerated. Continue current meds 

## 2014-07-08 NOTE — Assessment & Plan Note (Signed)
Creatinine has incresaed slightly. Increase hydration and recheck.

## 2014-07-08 NOTE — Assessment & Plan Note (Signed)
Tolerating statin, encouraged heart healthy diet, avoid trans fats, minimize simple carbs and saturated fats. Increase exercise as tolerated 

## 2014-07-08 NOTE — Assessment & Plan Note (Signed)
Proceed with IFOB. Increase leafy greens, consider increased lean red meat and using cast iron cookware. Continue to monitor, report any concerns

## 2014-07-08 NOTE — Assessment & Plan Note (Signed)
Well controlled, no changes to meds. Encouraged heart healthy diet such as the DASH diet and exercise as tolerated.  °

## 2014-07-08 NOTE — Assessment & Plan Note (Signed)
Patient denies any difficulties at home. No trouble with ADLs, depression or falls. No recent changes to vision or hearing. Is UTD with immunizations. Is UTD with screening. Discussed Advanced Directives, patient agrees to bring us copies of documents if can. Encouraged heart healthy diet, exercise as tolerated and adequate sleep. [facility], sees Dr Shirlee LatchMcLean of cardiology. Reviewed labwork. Return [place] 6 months or as needed

## 2014-07-14 ENCOUNTER — Telehealth: Payer: Self-pay | Admitting: *Deleted

## 2014-07-14 DIAGNOSIS — N289 Disorder of kidney and ureter, unspecified: Secondary | ICD-10-CM

## 2014-07-14 NOTE — Telephone Encounter (Signed)
-----   Message from Bradd CanaryStacey A Blyth, MD sent at 07/01/2014  9:15 AM EST ----- Notify labs pretty good except sugar marker up slightly, not enough to change meds just minimize simple carbs. Kidney functions off slightly more. Recommend he increase his hydration and then order a recheck on a BMP next month for CRI

## 2014-07-14 NOTE — Telephone Encounter (Signed)
Called and spoke with the pt on (07/01/14) and informed him of recent lab results and note.  Pt verbalized understanding.  Pt will call back to schedule a lab appt.  Future labs ordered and sent.//AB/CMA

## 2014-07-15 DIAGNOSIS — M25569 Pain in unspecified knee: Secondary | ICD-10-CM | POA: Diagnosis not present

## 2014-07-15 DIAGNOSIS — M2460 Ankylosis, unspecified joint: Secondary | ICD-10-CM | POA: Diagnosis not present

## 2014-07-15 DIAGNOSIS — M79671 Pain in right foot: Secondary | ICD-10-CM | POA: Diagnosis not present

## 2014-07-15 DIAGNOSIS — M899 Disorder of bone, unspecified: Secondary | ICD-10-CM | POA: Diagnosis not present

## 2014-07-23 ENCOUNTER — Telehealth: Payer: Self-pay | Admitting: *Deleted

## 2014-07-23 NOTE — Telephone Encounter (Signed)
-----  Message from Mosie Lukes, MD sent at 07/08/2014 10:23 PM EST ----- Please send patient an IFOB kit for new onset anemia

## 2014-07-23 NOTE — Telephone Encounter (Signed)
Called and spoke with the pt and informed him of the note below.  Pt verbalized understanding.  Pt stated he is Henry Turner and to mail the kit to him Henry Turner.  IFOB kit was mailed to the pt at [place] [address].//TU/UEK

## 2014-07-29 DIAGNOSIS — Z4789 Encounter for other orthopedic aftercare: Secondary | ICD-10-CM | POA: Diagnosis not present

## 2014-08-18 ENCOUNTER — Other Ambulatory Visit (INDEPENDENT_AMBULATORY_CARE_PROVIDER_SITE_OTHER): Payer: Medicare Other

## 2014-08-18 DIAGNOSIS — D649 Anemia, unspecified: Secondary | ICD-10-CM

## 2014-08-18 LAB — FECAL OCCULT BLOOD, IMMUNOCHEMICAL: FECAL OCCULT BLD: NEGATIVE

## 2014-08-18 NOTE — Addendum Note (Signed)
Addended by: Eustace QuailEABOLD, Zailee Vallely J on: 08/18/2014 02:16 PM   Modules accepted: Orders

## 2014-08-26 DIAGNOSIS — Z4789 Encounter for other orthopedic aftercare: Secondary | ICD-10-CM | POA: Diagnosis not present

## 2014-09-07 ENCOUNTER — Other Ambulatory Visit: Payer: Self-pay | Admitting: Family Medicine

## 2014-10-07 DIAGNOSIS — Z4789 Encounter for other orthopedic aftercare: Secondary | ICD-10-CM | POA: Diagnosis not present

## 2014-11-09 DIAGNOSIS — I2510 Atherosclerotic heart disease of native coronary artery without angina pectoris: Secondary | ICD-10-CM | POA: Diagnosis present

## 2014-11-09 DIAGNOSIS — N179 Acute kidney failure, unspecified: Secondary | ICD-10-CM | POA: Diagnosis not present

## 2014-11-09 DIAGNOSIS — N189 Chronic kidney disease, unspecified: Secondary | ICD-10-CM | POA: Diagnosis not present

## 2014-11-09 DIAGNOSIS — Z8612 Personal history of poliomyelitis: Secondary | ICD-10-CM | POA: Diagnosis not present

## 2014-11-09 DIAGNOSIS — I10 Essential (primary) hypertension: Secondary | ICD-10-CM | POA: Diagnosis not present

## 2014-11-09 DIAGNOSIS — A09 Infectious gastroenteritis and colitis, unspecified: Secondary | ICD-10-CM | POA: Diagnosis not present

## 2014-11-09 DIAGNOSIS — K922 Gastrointestinal hemorrhage, unspecified: Secondary | ICD-10-CM | POA: Diagnosis not present

## 2014-11-09 DIAGNOSIS — N400 Enlarged prostate without lower urinary tract symptoms: Secondary | ICD-10-CM | POA: Diagnosis not present

## 2014-11-09 DIAGNOSIS — Z1621 Resistance to vancomycin: Secondary | ICD-10-CM | POA: Diagnosis present

## 2014-11-09 DIAGNOSIS — E785 Hyperlipidemia, unspecified: Secondary | ICD-10-CM | POA: Diagnosis present

## 2014-11-09 DIAGNOSIS — N281 Cyst of kidney, acquired: Secondary | ICD-10-CM | POA: Diagnosis not present

## 2014-11-09 DIAGNOSIS — K529 Noninfective gastroenteritis and colitis, unspecified: Secondary | ICD-10-CM | POA: Diagnosis not present

## 2014-11-09 DIAGNOSIS — Z955 Presence of coronary angioplasty implant and graft: Secondary | ICD-10-CM | POA: Diagnosis not present

## 2014-11-09 DIAGNOSIS — R197 Diarrhea, unspecified: Secondary | ICD-10-CM | POA: Diagnosis not present

## 2014-11-09 DIAGNOSIS — R1084 Generalized abdominal pain: Secondary | ICD-10-CM | POA: Diagnosis not present

## 2014-11-09 DIAGNOSIS — E869 Volume depletion, unspecified: Secondary | ICD-10-CM | POA: Diagnosis not present

## 2014-11-09 DIAGNOSIS — B952 Enterococcus as the cause of diseases classified elsewhere: Secondary | ICD-10-CM | POA: Diagnosis present

## 2014-11-09 DIAGNOSIS — E119 Type 2 diabetes mellitus without complications: Secondary | ICD-10-CM | POA: Diagnosis not present

## 2014-11-09 DIAGNOSIS — Z7982 Long term (current) use of aspirin: Secondary | ICD-10-CM | POA: Diagnosis not present

## 2014-11-09 DIAGNOSIS — D62 Acute posthemorrhagic anemia: Secondary | ICD-10-CM | POA: Diagnosis not present

## 2014-11-10 DIAGNOSIS — K922 Gastrointestinal hemorrhage, unspecified: Secondary | ICD-10-CM | POA: Diagnosis not present

## 2014-11-10 DIAGNOSIS — I10 Essential (primary) hypertension: Secondary | ICD-10-CM | POA: Diagnosis not present

## 2014-11-10 DIAGNOSIS — K529 Noninfective gastroenteritis and colitis, unspecified: Secondary | ICD-10-CM | POA: Diagnosis not present

## 2014-12-03 DIAGNOSIS — I10 Essential (primary) hypertension: Secondary | ICD-10-CM | POA: Diagnosis not present

## 2014-12-03 DIAGNOSIS — E119 Type 2 diabetes mellitus without complications: Secondary | ICD-10-CM | POA: Diagnosis not present

## 2014-12-03 DIAGNOSIS — E785 Hyperlipidemia, unspecified: Secondary | ICD-10-CM | POA: Diagnosis not present

## 2014-12-03 DIAGNOSIS — K922 Gastrointestinal hemorrhage, unspecified: Secondary | ICD-10-CM | POA: Diagnosis not present

## 2014-12-03 DIAGNOSIS — I998 Other disorder of circulatory system: Secondary | ICD-10-CM | POA: Diagnosis not present

## 2014-12-03 DIAGNOSIS — I2510 Atherosclerotic heart disease of native coronary artery without angina pectoris: Secondary | ICD-10-CM | POA: Diagnosis not present

## 2014-12-03 DIAGNOSIS — N400 Enlarged prostate without lower urinary tract symptoms: Secondary | ICD-10-CM | POA: Diagnosis not present

## 2014-12-03 DIAGNOSIS — I509 Heart failure, unspecified: Secondary | ICD-10-CM | POA: Diagnosis not present

## 2014-12-03 DIAGNOSIS — K591 Functional diarrhea: Secondary | ICD-10-CM | POA: Diagnosis not present

## 2014-12-03 DIAGNOSIS — Z0000 Encounter for general adult medical examination without abnormal findings: Secondary | ICD-10-CM | POA: Diagnosis not present

## 2014-12-10 DIAGNOSIS — I509 Heart failure, unspecified: Secondary | ICD-10-CM | POA: Diagnosis not present

## 2014-12-10 DIAGNOSIS — I2510 Atherosclerotic heart disease of native coronary artery without angina pectoris: Secondary | ICD-10-CM | POA: Diagnosis not present

## 2014-12-10 DIAGNOSIS — E119 Type 2 diabetes mellitus without complications: Secondary | ICD-10-CM | POA: Diagnosis not present

## 2014-12-11 ENCOUNTER — Telehealth: Payer: Self-pay | Admitting: Family Medicine

## 2014-12-11 NOTE — Telephone Encounter (Signed)
Family informed.

## 2014-12-11 NOTE — Telephone Encounter (Signed)
Faxed results as requested

## 2014-12-11 NOTE — Telephone Encounter (Signed)
Caller name: Elnita Maxwell  Relation to pt: daughter Call back number: [phone] or [phone] Pharmacy:  Reason for call: Pt's daughter called to inform her dad is at [place] visiting her and pt had to visit a Dr. Isidore Moos at [place] they are wanting to know if his last lab results can be fax to him at [phone]. Please advise.

## 2014-12-17 DIAGNOSIS — I509 Heart failure, unspecified: Secondary | ICD-10-CM | POA: Diagnosis not present

## 2014-12-17 DIAGNOSIS — I2510 Atherosclerotic heart disease of native coronary artery without angina pectoris: Secondary | ICD-10-CM | POA: Diagnosis not present

## 2014-12-17 DIAGNOSIS — Z01810 Encounter for preprocedural cardiovascular examination: Secondary | ICD-10-CM | POA: Diagnosis not present

## 2014-12-17 DIAGNOSIS — G3189 Other specified degenerative diseases of nervous system: Secondary | ICD-10-CM | POA: Diagnosis not present

## 2014-12-17 DIAGNOSIS — R339 Retention of urine, unspecified: Secondary | ICD-10-CM | POA: Diagnosis not present

## 2014-12-17 DIAGNOSIS — R3919 Other difficulties with micturition: Secondary | ICD-10-CM | POA: Diagnosis not present

## 2014-12-17 DIAGNOSIS — I10 Essential (primary) hypertension: Secondary | ICD-10-CM | POA: Diagnosis not present

## 2014-12-17 DIAGNOSIS — Z01818 Encounter for other preprocedural examination: Secondary | ICD-10-CM | POA: Diagnosis not present

## 2014-12-17 DIAGNOSIS — N281 Cyst of kidney, acquired: Secondary | ICD-10-CM | POA: Diagnosis not present

## 2014-12-17 DIAGNOSIS — K591 Functional diarrhea: Secondary | ICD-10-CM | POA: Diagnosis not present

## 2014-12-17 DIAGNOSIS — E785 Hyperlipidemia, unspecified: Secondary | ICD-10-CM | POA: Diagnosis not present

## 2014-12-17 DIAGNOSIS — N189 Chronic kidney disease, unspecified: Secondary | ICD-10-CM | POA: Diagnosis not present

## 2014-12-21 DIAGNOSIS — E875 Hyperkalemia: Secondary | ICD-10-CM | POA: Diagnosis not present

## 2014-12-22 DIAGNOSIS — N189 Chronic kidney disease, unspecified: Secondary | ICD-10-CM | POA: Diagnosis not present

## 2014-12-22 DIAGNOSIS — R809 Proteinuria, unspecified: Secondary | ICD-10-CM | POA: Diagnosis not present

## 2014-12-26 IMAGING — CT CT CHEST W/O CM
2 of 3 series · 15 of 36 positions shown, 18 images · non-contrast
Comparison: 07/07/2013.

CLINICAL DATA: Abnormal chest x-ray.  Left apical pulmonary nodule.

EXAM:
CT CHEST WITHOUT CONTRAST
TECHNIQUE: Multidetector CT imaging of the chest was performed following the
standard protocol without IV contrast.

[Series 2: chest 5.0 b31f · axial · 0.59mm/px · z∈[-261,-31]mm · 12 of 55 slices shown, 15 images]
[im 5/55  mediastinal]
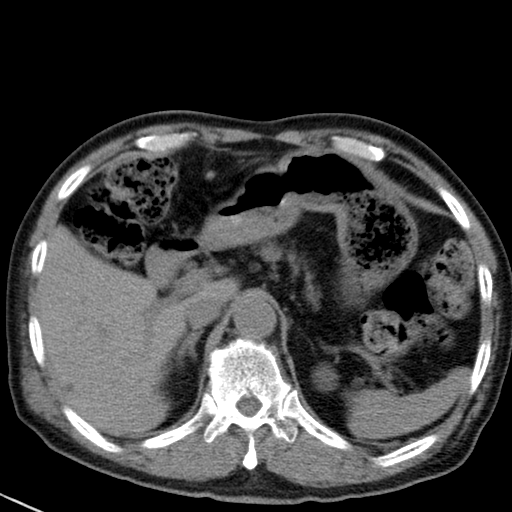
[im 5/55  lung]
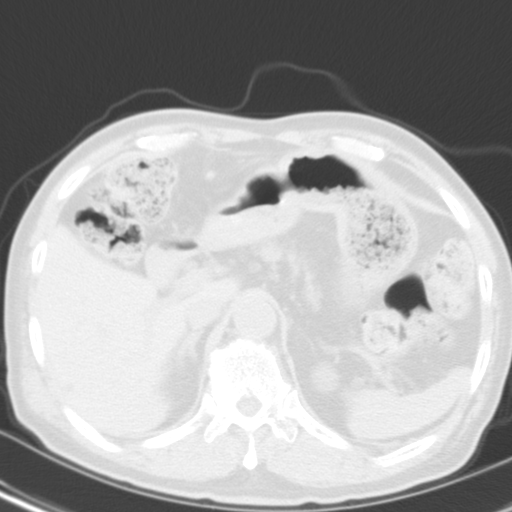
[im 9/55  lung]
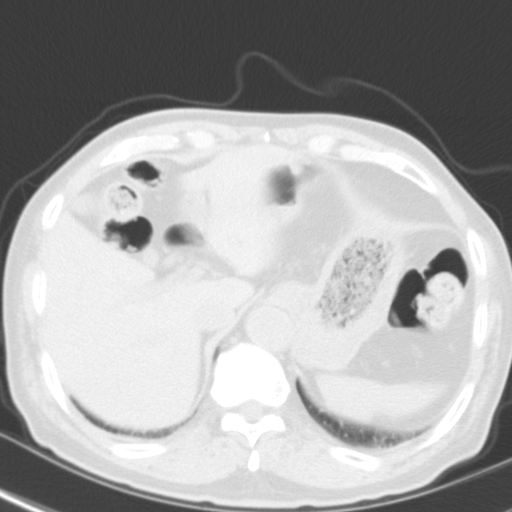
[im 13/55  lung]
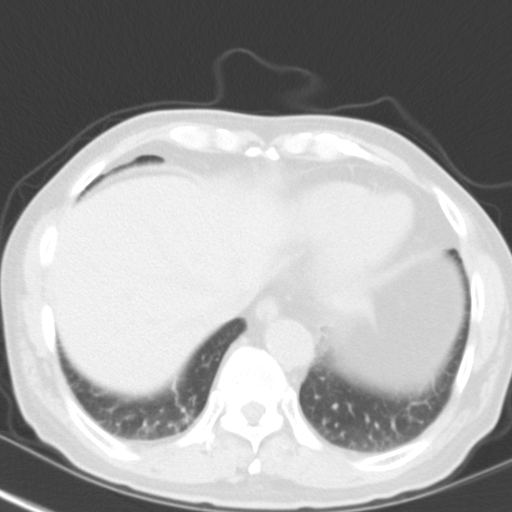
[im 17/55  lung]
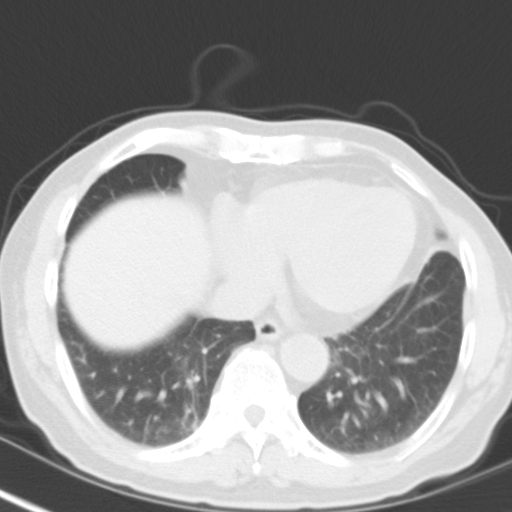
[im 21/55  mediastinal]
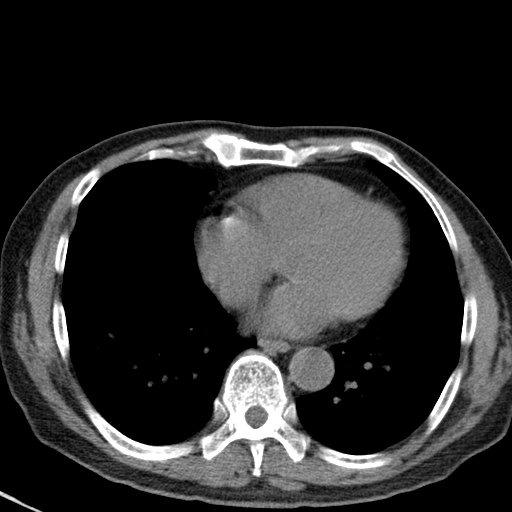
[im 21/55  lung]
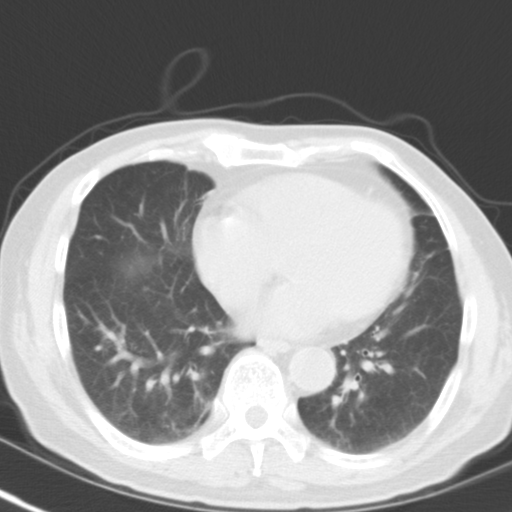
[im 25/55  lung]
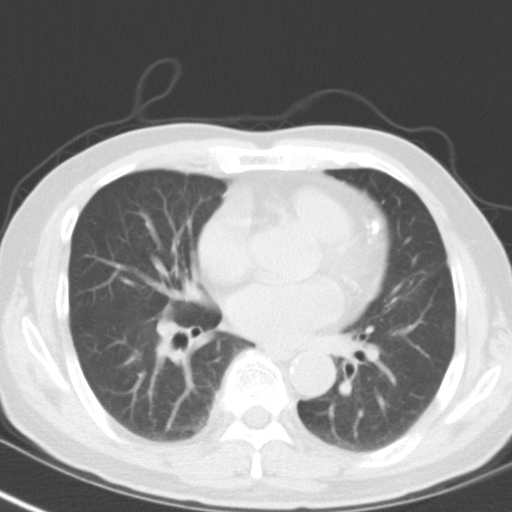
[im 31/55  lung]
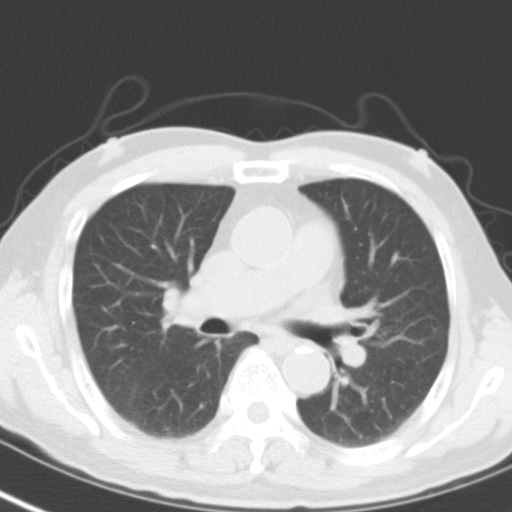
[im 35/55  lung]
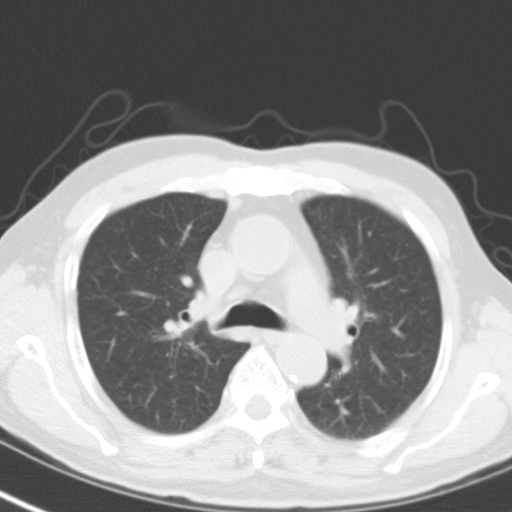
[im 39/55  mediastinal]
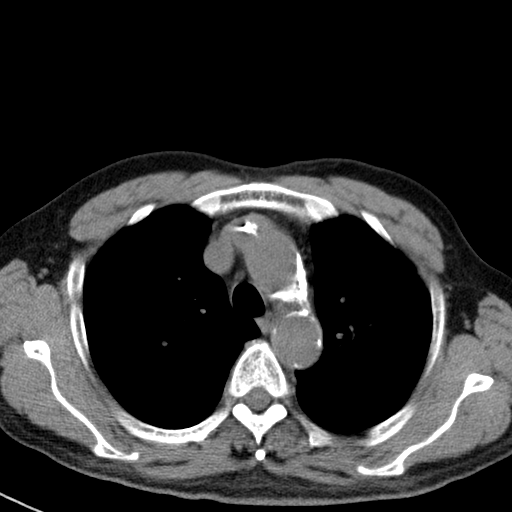
[im 39/55  lung]
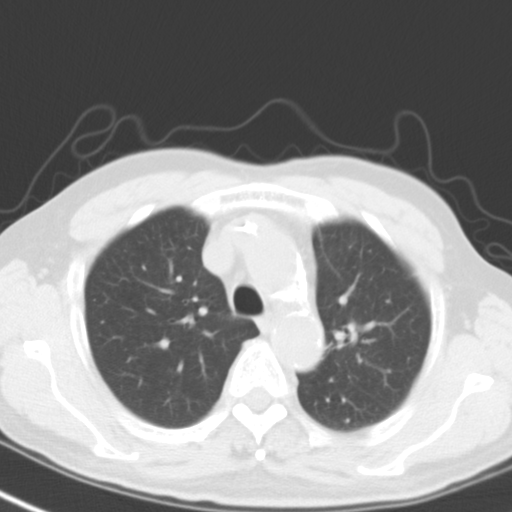
[im 43/55  lung]
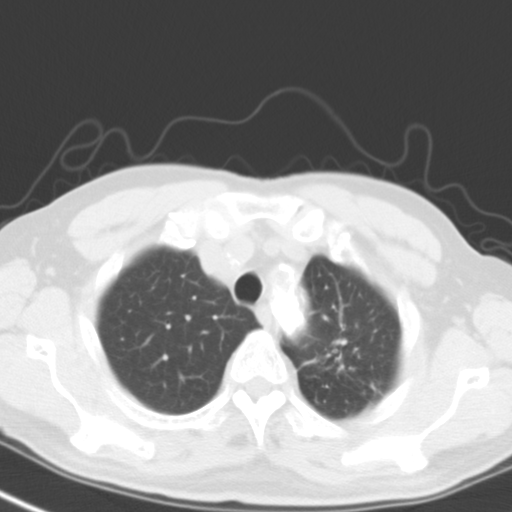
[im 47/55  lung]
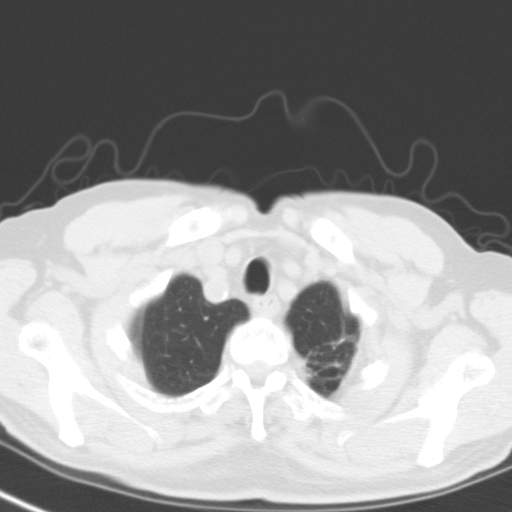
[im 51/55  lung]
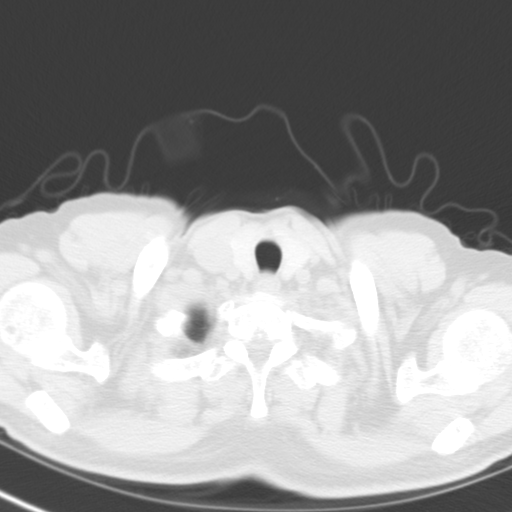

[Series 6: chest 3.0 coronal · coronal · 0.57mm/px · 3 of 77 slices shown]
[im 16/77  lung]
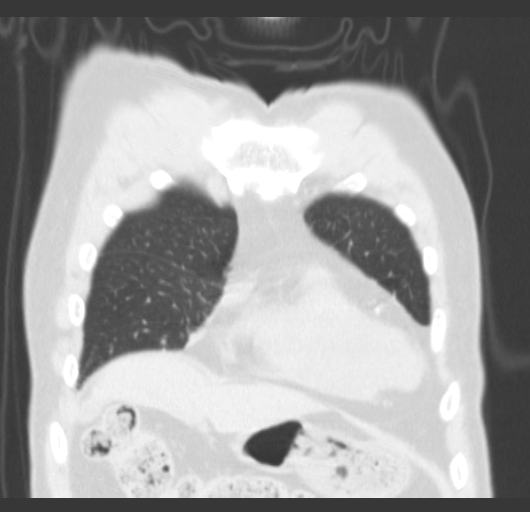
[im 31/77  lung]
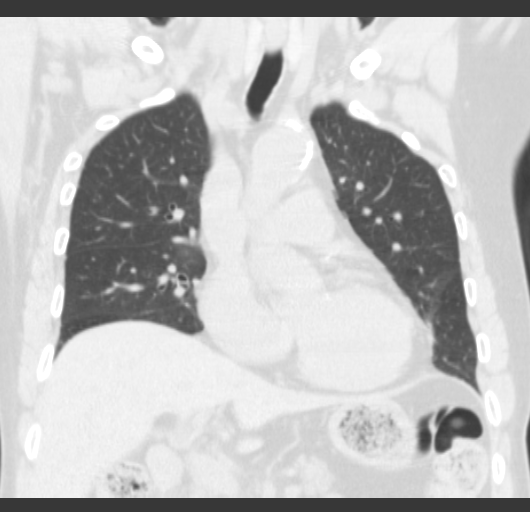
[im 46/77  lung]
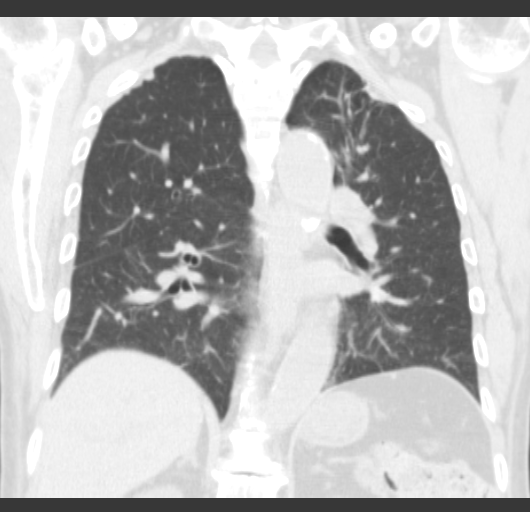

[15 of 36 positions shown; findings below may reference images not displayed]

FINDINGS: The opacity at the left apex on yesterday's chest radiograph
represents an area of scarring and old granulomatous disease.
Several calcified granuloma are present at the left apex.

Coronary artery atherosclerosis is present. If office based
assessment of coronary risk factors has not been performed, it is
now recommended. Mild bilateral gynecomastia is incidentally noted.
There is no axillary adenopathy. No mediastinal or hilar adenopathy
allowing for noncontrast technique. Incidental imaging of the upper
abdomen shows an exophytic left upper pole renal cysts. There is no
pericardial or pleural effusion. Aortic arch atherosclerosis is
present. No aggressive osseous lesions are present. Thoracic
vertebral body height and alignment is within normal limits.
IMPRESSION: IMPRESSION

1. The abnormality on chest radiograph represents pulmonary
parenchymal scarring and old granulomatous disease. No followup is
needed.
2. No active cardiopulmonary disease.

## 2015-01-06 DIAGNOSIS — Z4789 Encounter for other orthopedic aftercare: Secondary | ICD-10-CM | POA: Diagnosis not present

## 2015-02-02 DIAGNOSIS — R1084 Generalized abdominal pain: Secondary | ICD-10-CM | POA: Diagnosis not present

## 2015-02-02 DIAGNOSIS — Z01818 Encounter for other preprocedural examination: Secondary | ICD-10-CM | POA: Diagnosis not present

## 2015-02-05 DIAGNOSIS — Z1211 Encounter for screening for malignant neoplasm of colon: Secondary | ICD-10-CM | POA: Diagnosis not present

## 2015-02-05 DIAGNOSIS — K649 Unspecified hemorrhoids: Secondary | ICD-10-CM | POA: Diagnosis not present

## 2015-02-05 DIAGNOSIS — D649 Anemia, unspecified: Secondary | ICD-10-CM | POA: Diagnosis not present

## 2015-02-05 DIAGNOSIS — K2900 Acute gastritis without bleeding: Secondary | ICD-10-CM | POA: Diagnosis not present

## 2015-02-05 DIAGNOSIS — K2950 Unspecified chronic gastritis without bleeding: Secondary | ICD-10-CM | POA: Diagnosis not present

## 2015-02-05 DIAGNOSIS — K529 Noninfective gastroenteritis and colitis, unspecified: Secondary | ICD-10-CM | POA: Diagnosis not present

## 2015-02-05 DIAGNOSIS — K30 Functional dyspepsia: Secondary | ICD-10-CM | POA: Diagnosis not present

## 2015-02-05 LAB — HM COLONOSCOPY

## 2015-04-08 DIAGNOSIS — K2900 Acute gastritis without bleeding: Secondary | ICD-10-CM | POA: Diagnosis not present

## 2015-04-08 DIAGNOSIS — K649 Unspecified hemorrhoids: Secondary | ICD-10-CM | POA: Diagnosis not present

## 2015-04-12 ENCOUNTER — Other Ambulatory Visit: Payer: Self-pay | Admitting: Family Medicine

## 2015-04-12 MED ORDER — GLUCOSE BLOOD VI DISK: DISK | Status: DC

## 2015-04-25 ENCOUNTER — Encounter: Payer: Self-pay | Admitting: *Deleted

## 2015-04-25 DIAGNOSIS — Z0000 Encounter for general adult medical examination without abnormal findings: Secondary | ICD-10-CM

## 2015-04-26 NOTE — Congregational Nurse Program (Unsigned)
Congregational Nurse Program Note  Date of Encounter: 04/25/2015  Past Medical History: Past Medical History  Diagnosis Date  . CAD (coronary artery disease)     NSTEMI 2008 with tandem severe mid LAD stenoses treated with Xience V drug eluting stents. Lexiscan myovew Julie 3/11 showed EF 58%, no ischemia or infarction  . Hyperlipidemia   . Diabetes mellitus   . Post-polio syndrome     leg weakness  . HTN (hypertension)   . Anemia 04/12/2013    Encounter Details: Flu shot given

## 2015-05-11 ENCOUNTER — Ambulatory Visit (INDEPENDENT_AMBULATORY_CARE_PROVIDER_SITE_OTHER): Payer: Medicare Other | Admitting: Family Medicine

## 2015-05-11 ENCOUNTER — Encounter: Payer: Self-pay | Admitting: Family Medicine

## 2015-05-11 VITALS — BP 134/72 | HR 83 | Temp 97.6°F | Ht 64.0 in | Wt 139.5 lb

## 2015-05-11 DIAGNOSIS — E1122 Type 2 diabetes mellitus with diabetic chronic kidney disease: Secondary | ICD-10-CM | POA: Diagnosis not present

## 2015-05-11 DIAGNOSIS — N189 Chronic kidney disease, unspecified: Secondary | ICD-10-CM

## 2015-05-11 DIAGNOSIS — D649 Anemia, unspecified: Secondary | ICD-10-CM

## 2015-05-11 DIAGNOSIS — I10 Essential (primary) hypertension: Secondary | ICD-10-CM | POA: Diagnosis not present

## 2015-05-11 DIAGNOSIS — E782 Mixed hyperlipidemia: Secondary | ICD-10-CM | POA: Diagnosis not present

## 2015-05-11 MED ORDER — GLUCOSE BLOOD VI DISK: DISK | Status: DC

## 2015-05-11 MED ORDER — GLUCOSE BLOOD VI DISK: DISK | Status: AC

## 2015-05-11 NOTE — Patient Instructions (Signed)
Basic Carbohydrate Counting for Diabetes Mellitus Carbohydrate counting is a method for keeping track of the amount of carbohydrates you eat. Eating carbohydrates naturally increases the level of sugar (glucose) [place] your blood, so it is important for you to know the amount that is okay for you to have [place] every meal. Carbohydrate counting helps keep the level of glucose Marshal your blood within normal limits. The amount of carbohydrates allowed is different for every person. A dietitian can help you calculate the amount that is right for you. Once you know the amount of carbohydrates you can have, you can count the carbohydrates Ch[place] the foods you want to eat. Carbohydrates are found Alexa the following foods:  Grains, such as breads and cereals.  Dried beans and soy products.  Starchy vegetables, such as potatoes, peas, and corn.  Fruit and fruit juices.  Milk and yogurt.  Sweets and snack foods, such as cake, cookies, candy, chips, soft drinks, and fruit drinks. CARBOHYDRATE COUNTING There are two ways to count the carbohydrates Bunnie your food. You can use either of the methods or a combination of both. Reading the "Nutrition Facts" on Packaged Food The "Nutrition Facts" is an area that is included on the labels of almost all packaged food and beverages [place] the [place]. It includes the serving size of that food or beverage and information about the nutrients Ether each serving of the food, including the grams (g) of carbohydrate per serving.  Decide the number of servings of this food or beverage that you will be able to eat or drink. Multiply that number of servings by the number of grams of carbohydrate that is listed on the label for that serving. The total will be the amount of carbohydrates you will be having when you eat or drink this food or beverage. Learning Standard Serving Sizes of Food When you eat food that is not packaged or does not include "Nutrition Facts" on the label, you need to  measure the servings [place] order to count the amount of carbohydrates.A serving of most carbohydrate-rich foods contains about 15 g of carbohydrates. The following list includes serving sizes of carbohydrate-rich foods that provide 15 g ofcarbohydrate per serving:   1 slice of bread (1 oz) or 1 six-inch tortilla.    of a hamburger bun or English muffin.  4-6 crackers.   cup unsweetened dry cereal.    cup hot cereal.   cup rice or pasta.    cup mashed potatoes or  of a large baked potato.  1 cup fresh fruit or one small piece of fruit.    cup canned or frozen fruit or fruit juice.  1 cup milk.   cup plain fat-free yogurt or yogurt sweetened with artificial sweeteners.   cup cooked dried beans or starchy vegetable, such as peas, corn, or potatoes.  Decide the number of standard-size servings that you will eat. Multiply that number of servings by 15 (the grams of carbohydrates Jaxten that serving). For example, if you eat 2 cups of strawberries, you will have eaten 2 servings and 30 g of carbohydrates (2 servings x 15 g = 30 g). For foods such as soups and casseroles, [place] which more than one food is mixed [place], you will need to count the carbohydrates Brevin each food that is included. EXAMPLE OF CARBOHYDRATE COUNTING Sample Dinner  3 oz chicken breast.   cup of brown rice.   cup of corn.  1 cup milk.   1 cup strawberries with   sugar-free whipped topping.  Carbohydrate Calculation Step 1: Identify the foods that contain carbohydrates:   Rice.   Corn.   Milk.   Strawberries. Step 2:Calculate the number of servings eaten of each:   2 servings of rice.   1 serving of corn.   1 serving of milk.   1 serving of strawberries. Step 3: Multiply each of those number of servings by 15 g:   2 servings of rice x 15 g = 30 g.   1 serving of corn x 15 g = 15 g.   1 serving of milk x 15 g = 15 g.   1 serving of strawberries x 15 g = 15 g. Step 4: Add  together all of the amounts to find the total grams of carbohydrates eaten: 30 g + 15 g + 15 g + 15 g = 75 g.   This information is not intended to replace advice given to you by your health care provider. Make sure you discuss any questions you have with your health care provider.   Document Released: 06/19/2005 Document Revised: 07/10/2014 Document Reviewed: 05/16/2013 Elsevier Interactive Patient Education 2016 Elsevier Inc.  

## 2015-05-11 NOTE — Progress Notes (Signed)
Pre visit review using our clinic review tool, if applicable. No additional management support is needed unless otherwise documented below [place]ens the visit note. 

## 2015-05-12 ENCOUNTER — Other Ambulatory Visit (INDEPENDENT_AMBULATORY_CARE_PROVIDER_SITE_OTHER): Payer: Medicare Other

## 2015-05-12 DIAGNOSIS — D649 Anemia, unspecified: Secondary | ICD-10-CM | POA: Diagnosis not present

## 2015-05-12 DIAGNOSIS — I10 Essential (primary) hypertension: Secondary | ICD-10-CM | POA: Diagnosis not present

## 2015-05-12 DIAGNOSIS — E1122 Type 2 diabetes mellitus with diabetic chronic kidney disease: Secondary | ICD-10-CM

## 2015-05-12 DIAGNOSIS — E782 Mixed hyperlipidemia: Secondary | ICD-10-CM

## 2015-05-12 LAB — CBC
HEMATOCRIT: 33.1 % — AB (ref 39.0–52.0)
HEMOGLOBIN: 10.7 g/dL — AB (ref 13.0–17.0)
MCHC: 32.4 g/dL (ref 30.0–36.0)
MCV: 97.1 fl (ref 78.0–100.0)
PLATELETS: 125 10*3/uL — AB (ref 150.0–400.0)
RBC: 3.41 Mil/uL — ABNORMAL LOW (ref 4.22–5.81)
RDW: 13.9 % (ref 11.5–15.5)
WBC: 3.9 10*3/uL — AB (ref 4.0–10.5)

## 2015-05-12 LAB — LIPID PANEL
CHOL/HDL RATIO: 2
CHOLESTEROL: 88 mg/dL (ref 0–200)
HDL: 42.4 mg/dL (ref >39.00)
LDL CALC: 35 mg/dL (ref 0–99)
NonHDL: 45.54
TRIGLYCERIDES: 51 mg/dL (ref 0.0–149.0)
VLDL: 10.2 mg/dL (ref 0.0–40.0)

## 2015-05-12 LAB — COMPREHENSIVE METABOLIC PANEL
ALBUMIN: 4.2 g/dL (ref 3.5–5.2)
ALT: 21 U/L (ref 0–53)
AST: 23 U/L (ref 0–37)
Alkaline Phosphatase: 62 U/L (ref 39–117)
BUN: 36 mg/dL — ABNORMAL HIGH (ref 6–23)
CALCIUM: 9.9 mg/dL (ref 8.4–10.5)
CHLORIDE: 111 meq/L (ref 96–112)
CO2: 24 meq/L (ref 19–32)
CREATININE: 1.79 mg/dL — AB (ref 0.40–1.50)
GFR: 39.57 mL/min — AB (ref >60.00)
Glucose, Bld: 114 mg/dL — ABNORMAL HIGH (ref 70–99)
Potassium: 5.7 mEq/L — ABNORMAL HIGH (ref 3.5–5.1)
Sodium: 143 mEq/L (ref 135–145)
Total Bilirubin: 0.7 mg/dL (ref 0.2–1.2)
Total Protein: 7.4 g/dL (ref 6.0–8.3)

## 2015-05-12 LAB — TSH: TSH: 1.21 u[IU]/mL (ref 0.35–4.50)

## 2015-05-12 LAB — HEMOGLOBIN A1C: Hgb A1c MFr Bld: 6.5 % (ref 4.6–6.5)

## 2015-05-13 ENCOUNTER — Other Ambulatory Visit: Payer: Self-pay | Admitting: Family Medicine

## 2015-05-13 DIAGNOSIS — I10 Essential (primary) hypertension: Secondary | ICD-10-CM

## 2015-05-13 DIAGNOSIS — E785 Hyperlipidemia, unspecified: Secondary | ICD-10-CM

## 2015-05-13 NOTE — Telephone Encounter (Signed)
Updated medication list per lab results instructions. 

## 2015-05-19 ENCOUNTER — Telehealth: Payer: Self-pay

## 2015-05-20 ENCOUNTER — Other Ambulatory Visit (INDEPENDENT_AMBULATORY_CARE_PROVIDER_SITE_OTHER): Payer: Medicare Other

## 2015-05-20 DIAGNOSIS — E785 Hyperlipidemia, unspecified: Secondary | ICD-10-CM | POA: Diagnosis not present

## 2015-05-20 DIAGNOSIS — I10 Essential (primary) hypertension: Secondary | ICD-10-CM | POA: Diagnosis not present

## 2015-05-20 LAB — COMPREHENSIVE METABOLIC PANEL
ALBUMIN: 4 g/dL (ref 3.5–5.2)
ALK PHOS: 64 U/L (ref 39–117)
ALT: 20 U/L (ref 0–53)
AST: 21 U/L (ref 0–37)
BILIRUBIN TOTAL: 0.7 mg/dL (ref 0.2–1.2)
BUN: 45 mg/dL — AB (ref 6–23)
CO2: 24 mEq/L (ref 19–32)
CREATININE: 2.03 mg/dL — AB (ref 0.40–1.50)
Calcium: 9.5 mg/dL (ref 8.4–10.5)
Chloride: 110 mEq/L (ref 96–112)
GFR: 34.22 mL/min — ABNORMAL LOW (ref >60.00)
Glucose, Bld: 118 mg/dL — ABNORMAL HIGH (ref 70–99)
Potassium: 5.4 mEq/L — ABNORMAL HIGH (ref 3.5–5.1)
SODIUM: 141 meq/L (ref 135–145)
TOTAL PROTEIN: 7.2 g/dL (ref 6.0–8.3)

## 2015-05-21 ENCOUNTER — Encounter: Payer: Self-pay | Admitting: Family Medicine

## 2015-05-21 NOTE — Telephone Encounter (Signed)
I notified pt of the results and pt states that he will not be able to come back until after the first of the year. I advised him about the concern and he told me I will be out of town and I will come back after the first of the year. FYI

## 2015-05-22 NOTE — Progress Notes (Signed)
Subjective:    Patient ID: Henry Turner, male    DOB: 1940-09-06, 74 y.o.   MRN: [id number]  Chief Complaint  Patient presents with  . Follow-up    HPI Patient is Henry Turner today for follow-up. He is accompanied by his wife. He reports feeling well. He denies any recent illness. He denies polyuria or polydipsia. Remains active and tries to maintain a heart healthy diet and minimize his carbohydrate intake. Denies CP/palp/SOB/HA/congestion/fevers/GI or GU c/o. Taking meds as prescribed  Past Medical History  Diagnosis Date  . CAD (coronary artery disease)     NSTEMI 2008 with tandem severe mid LAD stenoses treated with Xience V drug eluting stents. Lexiscan myovew [place] 3/11 showed EF 58%, no ischemia or infarction  . Hyperlipidemia   . Diabetes mellitus   . Post-polio syndrome     leg weakness  . HTN (hypertension)   . Anemia 04/12/2013    Past Surgical History  Procedure Laterality Date  . Drug eluting stents      No family history on file.  Social History   Social History  . Marital Status: Single    Spouse Name: N/A  . Number of Children: N/A  . Years of Education: N/A   Occupational History  . Not on file.   Social History Main Topics  . Smoking status: Former Smoker    Types: Cigarettes  . Smokeless tobacco: Not on file     Comment: >20 yrs  . Alcohol Use: Not on file  . Drug Use: Not on file  . Sexual Activity: Not on file   Other Topics Concern  . Not on file   Social History Narrative   Lives [place] with family. Retired from [employer]. No family hx of premature CAD.     Outpatient Prescriptions Prior to Visit  Medication Sig Dispense Refill  . aspirin 81 MG tablet Take 81 mg by mouth daily.      . fish oil-omega-3 fatty acids 1000 MG capsule Take 2 capsules (2 g total) by mouth daily.    [name] Kitchen glipiZIDE (GLUCOTROL) 5 MG tablet TAKE ONE TABLET BY MOUTH ONCE DAILY 30 tablet 5  . Glucose Blood (BAYER BREEZE 2 TEST) DISK Check blood sugar once  daily.  DX E11.22 100 each 0  . amLODipine (NORVASC) 10 MG tablet TAKE ONE TABLET BY MOUTH ONCE DAILY 30 tablet 5  . clopidogrel (PLAVIX) 75 MG tablet TAKE ONE TABLET BY MOUTH ONCE DAILY 30 tablet 4  . lisinopril (PRINIVIL,ZESTRIL) 2.5 MG tablet TAKE ONE TABLET BY MOUTH ONCE DAILY 30 tablet 4  . simvastatin (ZOCOR) 20 MG tablet TAKE ONE TABLET BY MOUTH ONCE DAILY 30 tablet 4   No facility-administered medications prior to visit.    No Known Allergies  Review of Systems  Constitutional: Negative for fever and malaise/fatigue.  HENT: Negative for congestion.   Eyes: Negative for discharge.  Respiratory: Negative for shortness of breath.   Cardiovascular: Negative for chest pain, palpitations and leg swelling.  Gastrointestinal: Negative for nausea and abdominal pain.  Genitourinary: Negative for dysuria.  Musculoskeletal: Negative for falls.  Skin: Negative for rash.  Neurological: Negative for loss of consciousness and headaches.  Endo/Heme/Allergies: Negative for environmental allergies.  Psychiatric/Behavioral: Negative for depression. The patient is not nervous/anxious.        Objective:    Physical Exam  Constitutional: He is oriented to person, place, and time. He appears well-developed and well-nourished. No distress.  HENT:  Head: Normocephalic and  atraumatic.  Nose: Nose normal.  Eyes: Right eye exhibits no discharge. Left eye exhibits no discharge.  Neck: Normal range of motion. Neck supple.  Cardiovascular: Normal rate and regular rhythm.   No murmur heard. Pulmonary/Chest: Effort normal and breath sounds normal.  Abdominal: Soft. Bowel sounds are normal. There is no tenderness.  Musculoskeletal: He exhibits no edema.  Neurological: He is alert and oriented to person, place, and time.  Skin: Skin is warm and dry.  Psychiatric: He has a normal mood and affect.  Nursing note and vitals reviewed.   BP 134/72 mmHg  Pulse 83  Temp(Src) 97.6 F (36.4 C) (Oral)   Ht 5' 4"  (1.626 m)  Wt 139 lb 8 oz (63.277 kg)  BMI 23.93 kg/m2  SpO2 98% Wt Readings from Last 3 Encounters:  05/11/15 139 lb 8 oz (63.277 kg)  06/30/14 143 lb 6.4 oz (65.046 kg)  01/05/14 137 lb 0.6 oz (62.161 kg)     Lab Results  Component Value Date   WBC 3.9* 05/12/2015   HGB 10.7* 05/12/2015   HCT 33.1* 05/12/2015   PLT 125.0* 05/12/2015   GLUCOSE 118* 05/20/2015   CHOL 88 05/12/2015   TRIG 51.0 05/12/2015   HDL 42.40 05/12/2015   LDLCALC 35 05/12/2015   ALT 20 05/20/2015   AST 21 05/20/2015   NA 141 05/20/2015   K 5.4* 05/20/2015   CL 110 05/20/2015   CREATININE 2.03* 05/20/2015   BUN 45* 05/20/2015   CO2 24 05/20/2015   TSH 1.21 05/12/2015   INR 1.01 07/07/2013   HGBA1C 6.5 05/12/2015   MICROALBUR 25.66* 01/05/2014    Lab Results  Component Value Date   TSH 1.21 05/12/2015   Lab Results  Component Value Date   WBC 3.9* 05/12/2015   HGB 10.7* 05/12/2015   HCT 33.1* 05/12/2015   MCV 97.1 05/12/2015   PLT 125.0* 05/12/2015   Lab Results  Component Value Date   NA 141 05/20/2015   K 5.4* 05/20/2015   CO2 24 05/20/2015   GLUCOSE 118* 05/20/2015   BUN 45* 05/20/2015   CREATININE 2.03* 05/20/2015   BILITOT 0.7 05/20/2015   ALKPHOS 64 05/20/2015   AST 21 05/20/2015   ALT 20 05/20/2015   PROT 7.2 05/20/2015   ALBUMIN 4.0 05/20/2015   CALCIUM 9.5 05/20/2015   GFR 34.22* 05/20/2015   Lab Results  Component Value Date   CHOL 88 05/12/2015   Lab Results  Component Value Date   HDL 42.40 05/12/2015   Lab Results  Component Value Date   LDLCALC 35 05/12/2015   Lab Results  Component Value Date   TRIG 51.0 05/12/2015   Lab Results  Component Value Date   CHOLHDL 2 05/12/2015   Lab Results  Component Value Date   HGBA1C 6.5 05/12/2015       Assessment & Plan:   Problem List Items Addressed This Visit    Anemia    Increase leafy greens, consider increased lean red meat and using cast iron cookware. Continue to monitor, report any  concerns      Relevant Orders   CBC (Completed)   TSH (Completed)   Hemoglobin A1c (Completed)   Lipid panel (Completed)   Comprehensive metabolic panel (Completed)   CBC with Differential/Platelet   CKD (chronic kidney disease)    Patient advised to increased hydration, avoid over-the-counter medications and return for recheck of renal function. Patient declines returning due to leaving town but will return upon return. Advised to seek care  if any concerning symptoms develop.      Diabetes mellitus with chronic kidney disease (HCC)    hgba1c acceptable, minimize simple carbs. Increase exercise as tolerated. Continue current meds      Relevant Medications   lisinopril (PRINIVIL,ZESTRIL) 10 MG tablet   Other Relevant Orders   CBC (Completed)   TSH (Completed)   Hemoglobin A1c (Completed)   Lipid panel (Completed)   Comprehensive metabolic panel (Completed)   TSH   Lipid panel   Hemoglobin A1c   Comp Met (CMET)   CBC with Differential/Platelet   Essential hypertension    Well controlled, no changes to meds. Encouraged heart healthy diet such as the DASH diet and exercise as tolerated.       Relevant Medications   lisinopril (PRINIVIL,ZESTRIL) 10 MG tablet   Other Relevant Orders   CBC (Completed)   TSH (Completed)   Hemoglobin A1c (Completed)   Lipid panel (Completed)   Comprehensive metabolic panel (Completed)   TSH   Lipid panel   Hemoglobin A1c   Comp Met (CMET)   CBC with Differential/Platelet   Hyperlipidemia, mixed - Primary    Tolerating statin, encouraged heart healthy diet, avoid trans fats, minimize simple carbs and saturated fats. Increase exercise as tolerated      Relevant Medications   lisinopril (PRINIVIL,ZESTRIL) 10 MG tablet   Other Relevant Orders   CBC (Completed)   TSH (Completed)   Hemoglobin A1c (Completed)   Lipid panel (Completed)   Comprehensive metabolic panel (Completed)   TSH   Lipid panel   Hemoglobin A1c      I have  discontinued Mr. Degroff amLODipine, simvastatin, and clopidogrel. I am also having him maintain his aspirin, fish oil-omega-3 fatty acids, glipiZIDE, lisinopril, and Glucose Blood.  Meds ordered this encounter  Medications  . lisinopril (PRINIVIL,ZESTRIL) 10 MG tablet    Sig: Take 1/2 by mouth daily  . DISCONTD: atorvastatin (LIPITOR) 40 MG tablet    Sig: Take 40 mg by mouth daily.  [name] Kitchen DISCONTD: Glucose Blood (BAYER BREEZE 2 TEST) DISK    Sig: Check blood sugar once daily.  DX E11.22    Dispense:  100 each    Refill:  6  . Glucose Blood (BAYER BREEZE 2 TEST) DISK    Sig: Check blood sugar once daily.  DX E11.22    Dispense:  100 each    Refill:  6     Penni Homans, MD

## 2015-05-22 NOTE — Assessment & Plan Note (Signed)
Patient advised to increased hydration, avoid over-the-counter medications and return for recheck of renal function. Patient declines returning due to leaving town but will return upon return. Advised to seek care if any concerning symptoms develop.

## 2015-05-22 NOTE — Assessment & Plan Note (Signed)
hgba1c acceptable, minimize simple carbs. Increase exercise as tolerated. Continue current meds 

## 2015-05-22 NOTE — Assessment & Plan Note (Signed)
Increase leafy greens, consider increased lean red meat and using cast iron cookware. Continue to monitor, report any concerns 

## 2015-05-22 NOTE — Assessment & Plan Note (Signed)
Well controlled, no changes to meds. Encouraged heart healthy diet such as the DASH diet and exercise as tolerated.  °

## 2015-05-22 NOTE — Assessment & Plan Note (Signed)
Tolerating statin, encouraged heart healthy diet, avoid trans fats, minimize simple carbs and saturated fats. Increase exercise as tolerated 

## 2015-05-31 DIAGNOSIS — I10 Essential (primary) hypertension: Secondary | ICD-10-CM | POA: Diagnosis not present

## 2015-05-31 DIAGNOSIS — Z955 Presence of coronary angioplasty implant and graft: Secondary | ICD-10-CM | POA: Diagnosis not present

## 2015-05-31 DIAGNOSIS — R600 Localized edema: Secondary | ICD-10-CM | POA: Diagnosis not present

## 2015-06-02 ENCOUNTER — Telehealth: Payer: Self-pay

## 2015-06-02 DIAGNOSIS — R600 Localized edema: Secondary | ICD-10-CM | POA: Diagnosis not present

## 2015-06-02 NOTE — Telephone Encounter (Signed)
Daughter called back regarding medication changes. Reveiwed meds with patient. States she will fax order for medical clearance for Operation and removal of hardware.

## 2015-06-02 NOTE — Telephone Encounter (Signed)
Discussed medications with patients daughter.,

## 2015-06-02 NOTE — Telephone Encounter (Addendum)
Caller name: Tomasita MorrowCheryl Turner  Relation to pt: daughter  Call back number: [phone]   Reason for call:  Daughter would like to discuss message below, please call daughter and patient will be present.

## 2015-06-02 NOTE — Telephone Encounter (Signed)
Left message for call back.

## 2015-06-03 ENCOUNTER — Telehealth: Payer: Self-pay | Admitting: *Deleted

## 2015-06-03 NOTE — Telephone Encounter (Signed)
Received fax from Dr. Doy HutchingBerberian requesting medical clearance for surgery to remove hardware (1hour) under general anesthesia. Forwarded to Dr. Abner GreenspanBlyth. JG//CMA

## 2015-06-03 NOTE — Telephone Encounter (Signed)
Not aware of patient's hardware, due to his age and diabetic status, he should probably undergo cardiac clearance. Please let patient and doctor know if they agree I will refer. r

## 2015-06-04 DIAGNOSIS — I2510 Atherosclerotic heart disease of native coronary artery without angina pectoris: Secondary | ICD-10-CM | POA: Diagnosis not present

## 2015-06-04 DIAGNOSIS — I10 Essential (primary) hypertension: Secondary | ICD-10-CM | POA: Diagnosis not present

## 2015-06-04 DIAGNOSIS — I509 Heart failure, unspecified: Secondary | ICD-10-CM | POA: Diagnosis not present

## 2015-06-04 DIAGNOSIS — Z01810 Encounter for preprocedural cardiovascular examination: Secondary | ICD-10-CM | POA: Diagnosis not present

## 2015-06-14 DIAGNOSIS — I2510 Atherosclerotic heart disease of native coronary artery without angina pectoris: Secondary | ICD-10-CM | POA: Diagnosis not present

## 2015-06-14 DIAGNOSIS — I10 Essential (primary) hypertension: Secondary | ICD-10-CM | POA: Diagnosis not present

## 2015-06-14 DIAGNOSIS — I509 Heart failure, unspecified: Secondary | ICD-10-CM | POA: Diagnosis not present

## 2015-06-15 NOTE — Telephone Encounter (Signed)
Will clear as soon as cardiac clearance resolves

## 2015-06-15 NOTE — Telephone Encounter (Signed)
Patients daughter called back today requesting clearance to be faxed to [place].  The patient had a stress test and echo last week [place] [place]  Faxed over lab results. The daugher will have cardiology [place] fax over his results/clearance from cariology [place] order for PCP to clear.

## 2015-06-17 NOTE — Telephone Encounter (Signed)
Daughter called today requesting to know if surgical clearance done yet.  Date of surgery is-- Monday 12.19/2016 Surgeon name is--- Dr. Doy HutchingBerberian Procedure is--- hardware removed from right foot.   They have already received cardiology clearance  .

## 2015-06-17 NOTE — Telephone Encounter (Signed)
Caller name: Dionne AnoCheryl Sullivan Cell: [phone] Work: [phone]  Reason for call: She wants to make sure you received the fax with labwork from 06/02/15, RX for medical clearance, and 9 pages for the cardiology clearance.

## 2015-06-18 ENCOUNTER — Encounter: Payer: Self-pay | Admitting: Family Medicine

## 2015-06-18 NOTE — Telephone Encounter (Signed)
Daughter called to confirm the surgeon received everything needed. She wanted to thank you for everything you've done to help.

## 2015-06-18 NOTE — Telephone Encounter (Signed)
See note 11/8, warn them no aspirin, NSAIDS or fish oil til after procedure

## 2015-06-18 NOTE — Progress Notes (Addendum)
Pre Op Clearance: Patient scheduled for Hardware removal [place] his right foot with his surgeon, Dr Doy HutchingBerberian. Patient medically stable and doing well. Due to his multiple medical comorbidities cardiac clearance was requested and has now been received. Review of cardiac clearance paperwork reveals normal myocardial perfusion scan and no concerns. Patient is now medically cleared barring no changes Urban his clinical status. Patient will discuss any concerns regarding procedure with surgeon. Patient to hold all Aspirin, NSAIDs and fish oil capsules for one wekk prior to procedure.

## 2015-06-18 NOTE — Telephone Encounter (Signed)
Called both numbers left message to call back. 

## 2015-06-21 DIAGNOSIS — E119 Type 2 diabetes mellitus without complications: Secondary | ICD-10-CM | POA: Diagnosis not present

## 2015-06-21 DIAGNOSIS — E785 Hyperlipidemia, unspecified: Secondary | ICD-10-CM | POA: Diagnosis not present

## 2015-06-21 DIAGNOSIS — Z7982 Long term (current) use of aspirin: Secondary | ICD-10-CM | POA: Diagnosis not present

## 2015-06-21 DIAGNOSIS — Z955 Presence of coronary angioplasty implant and graft: Secondary | ICD-10-CM | POA: Diagnosis not present

## 2015-06-21 DIAGNOSIS — T849XXS Unspecified complication of internal orthopedic prosthetic device, implant and graft, sequela: Secondary | ICD-10-CM | POA: Diagnosis not present

## 2015-06-21 DIAGNOSIS — I10 Essential (primary) hypertension: Secondary | ICD-10-CM | POA: Diagnosis not present

## 2015-06-21 DIAGNOSIS — Z87891 Personal history of nicotine dependence: Secondary | ICD-10-CM | POA: Diagnosis not present

## 2015-06-21 DIAGNOSIS — Z8612 Personal history of poliomyelitis: Secondary | ICD-10-CM | POA: Diagnosis not present

## 2015-06-21 DIAGNOSIS — I2510 Atherosclerotic heart disease of native coronary artery without angina pectoris: Secondary | ICD-10-CM | POA: Diagnosis not present

## 2015-06-21 DIAGNOSIS — B91 Sequelae of poliomyelitis: Secondary | ICD-10-CM | POA: Diagnosis not present

## 2015-06-21 DIAGNOSIS — T8484XA Pain due to internal orthopedic prosthetic devices, implants and grafts, initial encounter: Secondary | ICD-10-CM | POA: Diagnosis not present

## 2015-06-30 ENCOUNTER — Encounter: Payer: Self-pay | Admitting: *Deleted

## 2015-07-06 DIAGNOSIS — E119 Type 2 diabetes mellitus without complications: Secondary | ICD-10-CM | POA: Diagnosis not present

## 2015-07-06 DIAGNOSIS — I10 Essential (primary) hypertension: Secondary | ICD-10-CM | POA: Diagnosis not present

## 2015-07-06 DIAGNOSIS — N183 Chronic kidney disease, stage 3 (moderate): Secondary | ICD-10-CM | POA: Diagnosis not present

## 2015-07-14 DIAGNOSIS — I10 Essential (primary) hypertension: Secondary | ICD-10-CM | POA: Diagnosis not present

## 2015-07-14 DIAGNOSIS — N183 Chronic kidney disease, stage 3 (moderate): Secondary | ICD-10-CM | POA: Diagnosis not present

## 2015-10-29 DIAGNOSIS — E785 Hyperlipidemia, unspecified: Secondary | ICD-10-CM | POA: Diagnosis not present

## 2015-10-29 DIAGNOSIS — I509 Heart failure, unspecified: Secondary | ICD-10-CM | POA: Diagnosis not present

## 2015-10-29 DIAGNOSIS — I10 Essential (primary) hypertension: Secondary | ICD-10-CM | POA: Diagnosis not present

## 2015-10-29 DIAGNOSIS — G14 Postpolio syndrome: Secondary | ICD-10-CM | POA: Diagnosis not present

## 2015-10-29 DIAGNOSIS — I2510 Atherosclerotic heart disease of native coronary artery without angina pectoris: Secondary | ICD-10-CM | POA: Diagnosis not present

## 2015-11-01 ENCOUNTER — Telehealth: Payer: Self-pay | Admitting: Family Medicine

## 2015-11-01 NOTE — Telephone Encounter (Signed)
Pt dropped off Documents to be filled out by PCP [facility](Document-[employer]- 10 pages)

## 2015-11-02 DIAGNOSIS — H40023 Open angle with borderline findings, high risk, bilateral: Secondary | ICD-10-CM | POA: Diagnosis not present

## 2015-11-02 DIAGNOSIS — E13319 Other specified diabetes mellitus with unspecified diabetic retinopathy without macular edema: Secondary | ICD-10-CM | POA: Diagnosis not present

## 2015-11-02 NOTE — Telephone Encounter (Signed)
Filled out as much as possible and forwarded to Dr. Blyth. JG//CMA 

## 2015-11-09 ENCOUNTER — Other Ambulatory Visit (INDEPENDENT_AMBULATORY_CARE_PROVIDER_SITE_OTHER): Payer: Medicare Other

## 2015-11-09 DIAGNOSIS — E1122 Type 2 diabetes mellitus with diabetic chronic kidney disease: Secondary | ICD-10-CM | POA: Diagnosis not present

## 2015-11-09 DIAGNOSIS — D649 Anemia, unspecified: Secondary | ICD-10-CM

## 2015-11-09 DIAGNOSIS — I10 Essential (primary) hypertension: Secondary | ICD-10-CM

## 2015-11-09 DIAGNOSIS — E782 Mixed hyperlipidemia: Secondary | ICD-10-CM | POA: Diagnosis not present

## 2015-11-09 LAB — COMPREHENSIVE METABOLIC PANEL
ALBUMIN: 4.1 g/dL (ref 3.5–5.2)
ALK PHOS: 58 U/L (ref 39–117)
ALT: 23 U/L (ref 0–53)
AST: 26 U/L (ref 0–37)
BUN: 46 mg/dL — ABNORMAL HIGH (ref 6–23)
CO2: 24 mEq/L (ref 19–32)
CREATININE: 2.21 mg/dL — AB (ref 0.40–1.50)
Calcium: 9.3 mg/dL (ref 8.4–10.5)
Chloride: 111 mEq/L (ref 96–112)
GFR: 30.98 mL/min — ABNORMAL LOW (ref >60.00)
Glucose, Bld: 89 mg/dL (ref 70–99)
Potassium: 5.2 mEq/L — ABNORMAL HIGH (ref 3.5–5.1)
SODIUM: 142 meq/L (ref 135–145)
TOTAL PROTEIN: 7.3 g/dL (ref 6.0–8.3)
Total Bilirubin: 0.9 mg/dL (ref 0.2–1.2)

## 2015-11-09 LAB — CBC WITH DIFFERENTIAL/PLATELET
BASOS ABS: 0 10*3/uL (ref 0.0–0.1)
Basophils Relative: 0.3 % (ref 0.0–3.0)
EOS PCT: 3.9 % (ref 0.0–5.0)
Eosinophils Absolute: 0.1 10*3/uL (ref 0.0–0.7)
HCT: 31.8 % — ABNORMAL LOW (ref 39.0–52.0)
HEMOGLOBIN: 10.6 g/dL — AB (ref 13.0–17.0)
LYMPHS ABS: 1 10*3/uL (ref 0.7–4.0)
Lymphocytes Relative: 30.3 % (ref 12.0–46.0)
MCHC: 33.3 g/dL (ref 30.0–36.0)
MCV: 96 fl (ref 78.0–100.0)
MONO ABS: 0.4 10*3/uL (ref 0.1–1.0)
Monocytes Relative: 11.5 % (ref 3.0–12.0)
NEUTROS PCT: 54 % (ref 43.0–77.0)
Neutro Abs: 1.8 10*3/uL (ref 1.4–7.7)
Platelets: 106 10*3/uL — ABNORMAL LOW (ref 150.0–400.0)
RBC: 3.31 Mil/uL — AB (ref 4.22–5.81)
RDW: 14.3 % (ref 11.5–15.5)
WBC: 3.3 10*3/uL — AB (ref 4.0–10.5)

## 2015-11-09 LAB — LIPID PANEL
CHOL/HDL RATIO: 2
CHOLESTEROL: 93 mg/dL (ref 0–200)
HDL: 48.7 mg/dL (ref >39.00)
LDL CALC: 34 mg/dL (ref 0–99)
NonHDL: 44.73
Triglycerides: 55 mg/dL (ref 0.0–149.0)
VLDL: 11 mg/dL (ref 0.0–40.0)

## 2015-11-09 LAB — TSH: TSH: 0.92 u[IU]/mL (ref 0.35–4.50)

## 2015-11-09 LAB — HEMOGLOBIN A1C: HEMOGLOBIN A1C: 6.8 % — AB (ref 4.6–6.5)

## 2015-11-15 ENCOUNTER — Ambulatory Visit: Payer: Medicare Other | Admitting: Family Medicine

## 2015-11-23 ENCOUNTER — Ambulatory Visit: Payer: Medicare Other | Admitting: Family Medicine

## 2015-12-28 ENCOUNTER — Encounter: Payer: Self-pay | Admitting: Family Medicine

## 2015-12-28 ENCOUNTER — Ambulatory Visit (INDEPENDENT_AMBULATORY_CARE_PROVIDER_SITE_OTHER): Payer: Medicare Other | Admitting: Family Medicine

## 2015-12-28 VITALS — BP 152/78 | HR 82 | Temp 97.9°F | Ht 64.0 in | Wt 144.1 lb

## 2015-12-28 DIAGNOSIS — R609 Edema, unspecified: Secondary | ICD-10-CM

## 2015-12-28 DIAGNOSIS — I10 Essential (primary) hypertension: Secondary | ICD-10-CM

## 2015-12-28 DIAGNOSIS — Z23 Encounter for immunization: Secondary | ICD-10-CM

## 2015-12-28 DIAGNOSIS — N289 Disorder of kidney and ureter, unspecified: Secondary | ICD-10-CM

## 2015-12-28 DIAGNOSIS — E782 Mixed hyperlipidemia: Secondary | ICD-10-CM | POA: Diagnosis not present

## 2015-12-28 DIAGNOSIS — E1122 Type 2 diabetes mellitus with diabetic chronic kidney disease: Secondary | ICD-10-CM | POA: Diagnosis not present

## 2015-12-28 DIAGNOSIS — D539 Nutritional anemia, unspecified: Secondary | ICD-10-CM

## 2015-12-28 DIAGNOSIS — D649 Anemia, unspecified: Secondary | ICD-10-CM

## 2015-12-28 DIAGNOSIS — E531 Pyridoxine deficiency: Secondary | ICD-10-CM | POA: Diagnosis not present

## 2015-12-28 LAB — CBC
HCT: 31.2 % — ABNORMAL LOW (ref 38.5–50.0)
HEMOGLOBIN: 9.9 g/dL — AB (ref 13.2–17.1)
MCH: 31.8 pg (ref 27.0–33.0)
MCHC: 31.7 g/dL — AB (ref 32.0–36.0)
MCV: 100.3 fL — AB (ref 80.0–100.0)
MPV: 11.9 fL (ref 7.5–12.5)
Platelets: 122 10*3/uL — ABNORMAL LOW (ref 140–400)
RBC: 3.11 MIL/uL — ABNORMAL LOW (ref 4.20–5.80)
RDW: 13.9 % (ref 11.0–15.0)
WBC: 3.2 10*3/uL — ABNORMAL LOW (ref 3.8–10.8)

## 2015-12-28 LAB — COMPREHENSIVE METABOLIC PANEL WITH GFR
ALT: 31 U/L (ref 0–53)
AST: 26 U/L (ref 0–37)
Albumin: 3.9 g/dL (ref 3.5–5.2)
Alkaline Phosphatase: 63 U/L (ref 39–117)
BUN: 45 mg/dL — ABNORMAL HIGH (ref 6–23)
CO2: 28 meq/L (ref 19–32)
Calcium: 9.1 mg/dL (ref 8.4–10.5)
Chloride: 110 meq/L (ref 96–112)
Creatinine, Ser: 2.14 mg/dL — ABNORMAL HIGH (ref 0.40–1.50)
GFR: 32.14 mL/min — ABNORMAL LOW
Glucose, Bld: 256 mg/dL — ABNORMAL HIGH (ref 70–99)
Potassium: 5.7 meq/L — ABNORMAL HIGH (ref 3.5–5.1)
Sodium: 140 meq/L (ref 135–145)
Total Bilirubin: 0.5 mg/dL (ref 0.2–1.2)
Total Protein: 6.9 g/dL (ref 6.0–8.3)

## 2015-12-28 LAB — VITAMIN B12: Vitamin B-12: 248 pg/mL (ref 211–911)

## 2015-12-28 MED ORDER — AMLODIPINE BESYLATE 5 MG PO TABS: 5.0000 mg | ORAL_TABLET | Freq: Every day | ORAL | Status: AC

## 2015-12-28 MED ORDER — GLIPIZIDE 5 MG PO TABS: 5.0000 mg | ORAL_TABLET | Freq: Every day | ORAL | Status: DC

## 2015-12-28 NOTE — Patient Instructions (Signed)
Anemia, Nonspecific Anemia is a condition [place] which the concentration of red blood cells or hemoglobin [place] the blood is below normal. Hemoglobin is a substance Dorn red blood cells that carries oxygen to the tissues of the body. Anemia results K[place] not enough oxygen reaching these tissues.  CAUSES  Common causes of anemia include:   Excessive bleeding. Bleeding may be internal or external. This includes excessive bleeding from periods (Kester women) or from the intestine.   Poor nutrition.   Chronic kidney, thyroid, and liver disease.  Bone marrow disorders that decrease red blood cell production.  Cancer and treatments for cancer.  HIV, AIDS, and their treatments.  Spleen problems that increase red blood cell destruction.  Blood disorders.  Excess destruction of red blood cells due to infection, medicines, and autoimmune disorders. SIGNS AND SYMPTOMS   Minor weakness.   Dizziness.   Headache.  Palpitations.   Shortness of breath, especially with exercise.   Paleness.  Cold sensitivity.  Indigestion.  Nausea.  Difficulty sleeping.  Difficulty concentrating. Symptoms may occur suddenly or they may develop slowly.  DIAGNOSIS  Additional blood tests are often needed. These help your health care provider determine the best treatment. Your health care provider will check your stool for blood and look for other causes of blood loss.  TREATMENT  Treatment varies depending on the cause of the anemia. Treatment can include:   Supplements of iron, vitamin B12, or folic acid.   Hormone medicines.   A blood transfusion. This may be needed if blood loss is severe.   Hospitalization. This may be needed if there is significant continual blood loss.   Dietary changes.  Spleen removal. HOME CARE INSTRUCTIONS Keep all follow-up appointments. It often takes many weeks to correct anemia, and having your health care provider check on your condition and your response to  treatment is very important. SEEK IMMEDIATE MEDICAL CARE IF:   You develop extreme weakness, shortness of breath, or chest pain.   You become dizzy or have trouble concentrating.  You develop heavy vaginal bleeding.   You develop a rash.   You have bloody or black, tarry stools.   You faint.   You vomit up blood.   You vomit repeatedly.   You have abdominal pain.  You have a fever or persistent symptoms for more than 2-3 days.   You have a fever and your symptoms suddenly get worse.   You are dehydrated.  MAKE SURE YOU:  Understand these instructions.  Will watch your condition.  Will get help right away if you are not doing well or get worse.   This information is not intended to replace advice given to you by your health care provider. Make sure you discuss any questions you have with your health care provider.   Document Released: 07/27/2004 Document Revised: 02/19/2013 Document Reviewed: 12/13/2012 Elsevier Interactive Patient Education 2016 Elsevier Inc.  

## 2015-12-28 NOTE — Progress Notes (Signed)
Pre visit review using our clinic review tool, if applicable. No additional management support is needed unless otherwise documented below Henry Turner the visit note. 

## 2015-12-28 NOTE — Assessment & Plan Note (Signed)
Tolerating statin, encouraged heart healthy diet, avoid trans fats, minimize simple carbs and saturated fats. Increase exercise as tolerated 

## 2015-12-28 NOTE — Assessment & Plan Note (Signed)
Systolic 120s at home, elevated today. Will stop LIsinopril today. Check cmp if renal function improved some may restart Lisinopril at 2.5 mg daily. Start Amlodipine 5 mg daiy

## 2015-12-28 NOTE — Assessment & Plan Note (Signed)
Check B vitamins, cbc and intrinsic factor

## 2015-12-28 NOTE — Progress Notes (Signed)
Patient ID: Henry Turner, male   DOB: 1941-04-09, 75 y.o.   MRN: [id number]   Subjective:    Patient ID: Henry Turner, male    DOB: 1941-04-09, 75 y.o.   MRN: [id number]  Chief Complaint  Patient presents with  . Follow-up     6 month    HPI Patient is Henry Turner today for follow up. Is feeling well today. No recent illness or acute concerns. No recent hospitalizations. Blood pressures have been well controlled at home. Denies CP/palp/SOB/HA/congestion/fevers/GI or GU c/o. Taking meds as prescribed  Past Medical History  Diagnosis Date  . CAD (coronary artery disease)     NSTEMI 2008 with tandem severe mid LAD stenoses treated with Xience V drug eluting stents. Lexiscan myovew [place] 3/11 showed EF 58%, no ischemia or infarction  . Hyperlipidemia   . Diabetes mellitus   . Post-polio syndrome     leg weakness  . HTN (hypertension)   . Anemia 04/12/2013    Past Surgical History  Procedure Laterality Date  . Drug eluting stents      No family history on file.  Social History   Social History  . Marital Status: Single    Spouse Name: N/A  . Number of Children: N/A  . Years of Education: N/A   Occupational History  . Not on file.   Social History Main Topics  . Smoking status: Former Smoker    Types: Cigarettes  . Smokeless tobacco: Not on file     Comment: >20 yrs  . Alcohol Use: Not on file  . Drug Use: Not on file  . Sexual Activity: Not on file   Other Topics Concern  . Not on file   Social History Narrative   Lives [place] with family. Retired from [employer]. No family hx of premature CAD.     Outpatient Prescriptions Prior to Visit  Medication Sig Dispense Refill  . aspirin 81 MG tablet Take 81 mg by mouth daily.      . fish oil-omega-3 fatty acids 1000 MG capsule Take 2 capsules (2 g total) by mouth daily.    . Glucose Blood (BAYER BREEZE 2 TEST) DISK Check blood sugar once daily.  DX E11.22 100 each 6  . atorvastatin (LIPITOR) 20 MG tablet Take  20 mg by mouth daily.    [name] Kitchen. glipiZIDE (GLUCOTROL) 5 MG tablet TAKE ONE TABLET BY MOUTH ONCE DAILY 30 tablet 5  . lisinopril (PRINIVIL,ZESTRIL) 10 MG tablet Take 1/2 by mouth daily     No facility-administered medications prior to visit.    No Known Allergies  Review of Systems  Constitutional: Negative for fever and malaise/fatigue.  HENT: Negative for congestion.   Eyes: Negative for blurred vision.  Respiratory: Negative for shortness of breath.   Cardiovascular: Negative for chest pain, palpitations and leg swelling.  Gastrointestinal: Negative for nausea, abdominal pain and blood Henry Turner stool.  Genitourinary: Negative for dysuria and frequency.  Musculoskeletal: Negative for falls.  Skin: Negative for rash.  Neurological: Negative for dizziness, loss of consciousness and headaches.  Endo/Heme/Allergies: Negative for environmental allergies.  Psychiatric/Behavioral: Negative for depression. The patient is not nervous/anxious.        Objective:    Physical Exam  Constitutional: He is oriented to person, place, and time. He appears well-developed and well-nourished. No distress.  HENT:  Head: Normocephalic and atraumatic.  Nose: Nose normal.  Eyes: Right eye exhibits no discharge. Left eye exhibits no discharge.  Neck: Normal range of  motion. Neck supple.  Cardiovascular: Normal rate and regular rhythm.   No murmur heard. Pulmonary/Chest: Effort normal and breath sounds normal.  Abdominal: Soft. Bowel sounds are normal. There is no tenderness.  Musculoskeletal: He exhibits no edema.  Neurological: He is alert and oriented to person, place, and time.  Skin: Skin is warm and dry.  Psychiatric: He has a normal mood and affect.  Nursing note and vitals reviewed.   BP 152/78 mmHg  Pulse 82  Temp(Src) 97.9 F (36.6 C) (Oral)  Ht 5\' 4"  (1.626 m)  Wt 144 lb 2 oz (65.375 kg)  BMI 24.73 kg/m2  SpO2 97% Wt Readings from Last 3 Encounters:  12/28/15 144 lb 2 oz (65.375 kg)    05/11/15 139 lb 8 oz (63.277 kg)  06/30/14 143 lb 6.4 oz (65.046 kg)     Lab Results  Component Value Date   WBC 3.3* 11/09/2015   HGB 10.6* 11/09/2015   HCT 31.8* 11/09/2015   PLT 106.0* 11/09/2015   GLUCOSE 89 11/09/2015   CHOL 93 11/09/2015   TRIG 55.0 11/09/2015   HDL 48.70 11/09/2015   LDLCALC 34 11/09/2015   ALT 23 11/09/2015   AST 26 11/09/2015   NA 142 11/09/2015   K 5.2* 11/09/2015   CL 111 11/09/2015   CREATININE 2.21* 11/09/2015   BUN 46* 11/09/2015   CO2 24 11/09/2015   TSH 0.92 11/09/2015   INR 1.01 07/07/2013   HGBA1C 6.8* 11/09/2015   MICROALBUR 25.66* 01/05/2014    Lab Results  Component Value Date   TSH 0.92 11/09/2015   Lab Results  Component Value Date   WBC 3.3* 11/09/2015   HGB 10.6* 11/09/2015   HCT 31.8* 11/09/2015   MCV 96.0 11/09/2015   PLT 106.0* 11/09/2015   Lab Results  Component Value Date   NA 142 11/09/2015   K 5.2* 11/09/2015   CO2 24 11/09/2015   GLUCOSE 89 11/09/2015   BUN 46* 11/09/2015   CREATININE 2.21* 11/09/2015   BILITOT 0.9 11/09/2015   ALKPHOS 58 11/09/2015   AST 26 11/09/2015   ALT 23 11/09/2015   PROT 7.3 11/09/2015   ALBUMIN 4.1 11/09/2015   CALCIUM 9.3 11/09/2015   GFR 30.98* 11/09/2015   Lab Results  Component Value Date   CHOL 93 11/09/2015   Lab Results  Component Value Date   HDL 48.70 11/09/2015   Lab Results  Component Value Date   LDLCALC 34 11/09/2015   Lab Results  Component Value Date   TRIG 55.0 11/09/2015   Lab Results  Component Value Date   CHOLHDL 2 11/09/2015   Lab Results  Component Value Date   HGBA1C 6.8* 11/09/2015       Assessment & Plan:   Problem List Items Addressed This Visit    Peripheral edema   Relevant Orders   CBC   Comprehensive metabolic panel   Vitamin B12   Intrinsic Factor Antibodies   Vitamin B1, whole blood   Vitamin B6   Vitamin B3   Retic   Hyperlipidemia, mixed    Tolerating statin, encouraged heart healthy diet, avoid trans fats,  minimize simple carbs and saturated fats. Increase exercise as tolerated      Relevant Medications   atorvastatin (LIPITOR) 40 MG tablet   amLODipine (NORVASC) 5 MG tablet   Other Relevant Orders   CBC   Comprehensive metabolic panel   Vitamin B12   Intrinsic Factor Antibodies   Vitamin B1, whole blood   Vitamin B6  Vitamin B3   Retic   Essential hypertension    Systolic 120s at home, elevated today. Will stop LIsinopril today. Check cmp if renal function improved some may restart Lisinopril at 2.5 mg daily. Start Amlodipine 5 mg daiy      Relevant Medications   atorvastatin (LIPITOR) 40 MG tablet   amLODipine (NORVASC) 5 MG tablet   Other Relevant Orders   CBC   Comprehensive metabolic panel   Vitamin B12   Intrinsic Factor Antibodies   Vitamin B1, whole blood   Vitamin B6   Vitamin B3   Retic   Diabetes mellitus with chronic kidney disease (HCC)    hgba1c acceptable, minimize simple carbs. Increase exercise as tolerated. Continue current meds      Relevant Medications   atorvastatin (LIPITOR) 40 MG tablet   glipiZIDE (GLUCOTROL) 5 MG tablet   Anemia    Check B vitamins, cbc and intrinsic factor      Relevant Orders   CBC   Comprehensive metabolic panel   Vitamin B12   Intrinsic Factor Antibodies   Vitamin B1, whole blood   Vitamin B6   Vitamin B3   Retic    Other Visit Diagnoses    Macrocytic anemia    -  Primary    Relevant Orders    CBC    Comprehensive metabolic panel    Vitamin B12    Intrinsic Factor Antibodies    Vitamin B1, whole blood    Vitamin B6    Vitamin B3    Retic    Renal insufficiency        Relevant Orders    CBC    Comprehensive metabolic panel    Vitamin B12    Intrinsic Factor Antibodies    Vitamin B1, whole blood    Vitamin B6    Vitamin B3    Retic       I have discontinued Mr. Milby lisinopril. I have also changed his glipiZIDE. Additionally, I am having him start on amLODipine. Lastly, I am having him maintain  his aspirin, fish oil-omega-3 fatty acids, Glucose Blood, and atorvastatin.  Meds ordered this encounter  Medications  . atorvastatin (LIPITOR) 40 MG tablet    Sig: Take 1 tablet by mouth daily.    Refill:  0  . glipiZIDE (GLUCOTROL) 5 MG tablet    Sig: Take 1 tablet (5 mg total) by mouth daily.    Dispense:  90 tablet    Refill:  2  . amLODipine (NORVASC) 5 MG tablet    Sig: Take 1 tablet (5 mg total) by mouth daily.    Dispense:  30 tablet    Refill:  3     Danise Edge, MD

## 2015-12-28 NOTE — Assessment & Plan Note (Signed)
hgba1c acceptable, minimize simple carbs. Increase exercise as tolerated. Continue current meds 

## 2015-12-29 LAB — RETICULOCYTES
ABS Retic: 37560 cells/uL (ref 25000–90000)
RBC.: 3.13 MIL/uL — ABNORMAL LOW (ref 4.20–5.80)
Retic Ct Pct: 1.2 %

## 2015-12-31 LAB — VITAMIN B1, WHOLE BLOOD

## 2015-12-31 LAB — VITAMIN B6: VITAMIN B6: 12.2 ng/mL (ref 2.1–21.7)

## 2015-12-31 LAB — INTRINSIC FACTOR ANTIBODIES: INTRINSIC FACTOR: NEGATIVE

## 2016-01-04 LAB — VITAMIN B3: Nicotinamide: <20 ng/mL

## 2016-01-04 LAB — VITAMIN B1: VITAMIN B1 (THIAMINE): 11 nmol/L (ref 8–30)

## 2016-02-17 ENCOUNTER — Encounter: Payer: Self-pay | Admitting: Family Medicine

## 2016-02-17 ENCOUNTER — Ambulatory Visit (INDEPENDENT_AMBULATORY_CARE_PROVIDER_SITE_OTHER): Payer: Medicare Other | Admitting: Family Medicine

## 2016-02-17 VITALS — BP 130/64 | HR 74 | Temp 97.9°F | Ht 64.0 in | Wt 137.5 lb

## 2016-02-17 DIAGNOSIS — E1122 Type 2 diabetes mellitus with diabetic chronic kidney disease: Secondary | ICD-10-CM

## 2016-02-17 NOTE — Progress Notes (Signed)
Pre visit review using our clinic review tool, if applicable. No additional management support is needed unless otherwise documented below Aarion the visit note. 

## 2016-02-27 NOTE — Progress Notes (Signed)
Patient ID: Henry Turner, male   DOB: 03/15/1941, 75 y.o.   MRN: [id number] Patient had to leave before being seen.

## 2016-06-15 ENCOUNTER — Telehealth: Payer: Self-pay | Admitting: *Deleted

## 2016-06-15 NOTE — Telephone Encounter (Signed)
Pt states he is Henry Turner and when he comes down here he will call us to schedule. Declines to schedule AWV or PCP follow-up at time of call.

## 2016-08-01 DIAGNOSIS — Z7984 Long term (current) use of oral hypoglycemic drugs: Secondary | ICD-10-CM | POA: Diagnosis not present

## 2016-08-01 DIAGNOSIS — E785 Hyperlipidemia, unspecified: Secondary | ICD-10-CM | POA: Diagnosis not present

## 2016-08-01 DIAGNOSIS — E119 Type 2 diabetes mellitus without complications: Secondary | ICD-10-CM | POA: Diagnosis not present

## 2016-08-01 DIAGNOSIS — I10 Essential (primary) hypertension: Secondary | ICD-10-CM | POA: Diagnosis not present

## 2016-08-02 DIAGNOSIS — E875 Hyperkalemia: Secondary | ICD-10-CM | POA: Diagnosis not present

## 2016-08-09 DIAGNOSIS — I10 Essential (primary) hypertension: Secondary | ICD-10-CM | POA: Diagnosis not present

## 2016-08-09 DIAGNOSIS — E875 Hyperkalemia: Secondary | ICD-10-CM | POA: Diagnosis not present

## 2016-08-09 DIAGNOSIS — N184 Chronic kidney disease, stage 4 (severe): Secondary | ICD-10-CM | POA: Diagnosis not present

## 2016-08-14 DIAGNOSIS — I10 Essential (primary) hypertension: Secondary | ICD-10-CM | POA: Diagnosis not present

## 2016-08-16 ENCOUNTER — Other Ambulatory Visit: Payer: Self-pay | Admitting: Family Medicine

## 2016-08-29 DIAGNOSIS — E875 Hyperkalemia: Secondary | ICD-10-CM | POA: Diagnosis not present

## 2016-08-29 DIAGNOSIS — E119 Type 2 diabetes mellitus without complications: Secondary | ICD-10-CM | POA: Diagnosis not present

## 2016-08-29 DIAGNOSIS — Z8612 Personal history of poliomyelitis: Secondary | ICD-10-CM | POA: Diagnosis not present

## 2016-08-29 DIAGNOSIS — Z7984 Long term (current) use of oral hypoglycemic drugs: Secondary | ICD-10-CM | POA: Diagnosis not present

## 2016-08-29 DIAGNOSIS — I10 Essential (primary) hypertension: Secondary | ICD-10-CM | POA: Diagnosis not present

## 2016-08-29 DIAGNOSIS — E785 Hyperlipidemia, unspecified: Secondary | ICD-10-CM | POA: Diagnosis not present

## 2016-08-29 DIAGNOSIS — Z0000 Encounter for general adult medical examination without abnormal findings: Secondary | ICD-10-CM | POA: Diagnosis not present

## 2016-08-31 DIAGNOSIS — E875 Hyperkalemia: Secondary | ICD-10-CM | POA: Diagnosis not present

## 2016-08-31 DIAGNOSIS — N184 Chronic kidney disease, stage 4 (severe): Secondary | ICD-10-CM | POA: Diagnosis not present

## 2016-08-31 DIAGNOSIS — I2510 Atherosclerotic heart disease of native coronary artery without angina pectoris: Secondary | ICD-10-CM | POA: Diagnosis not present

## 2016-08-31 DIAGNOSIS — I10 Essential (primary) hypertension: Secondary | ICD-10-CM | POA: Diagnosis not present

## 2016-09-11 DIAGNOSIS — N184 Chronic kidney disease, stage 4 (severe): Secondary | ICD-10-CM | POA: Diagnosis not present

## 2016-09-11 DIAGNOSIS — E0801 Diabetes mellitus due to underlying condition with hyperosmolarity with coma: Secondary | ICD-10-CM | POA: Diagnosis not present

## 2016-09-11 DIAGNOSIS — I10 Essential (primary) hypertension: Secondary | ICD-10-CM | POA: Diagnosis not present

## 2016-09-11 DIAGNOSIS — I2510 Atherosclerotic heart disease of native coronary artery without angina pectoris: Secondary | ICD-10-CM | POA: Diagnosis not present

## 2016-09-19 DIAGNOSIS — N184 Chronic kidney disease, stage 4 (severe): Secondary | ICD-10-CM | POA: Diagnosis not present

## 2016-09-19 DIAGNOSIS — N200 Calculus of kidney: Secondary | ICD-10-CM | POA: Diagnosis not present

## 2016-09-19 DIAGNOSIS — N281 Cyst of kidney, acquired: Secondary | ICD-10-CM | POA: Diagnosis not present

## 2016-09-26 DIAGNOSIS — E113293 Type 2 diabetes mellitus with mild nonproliferative diabetic retinopathy without macular edema, bilateral: Secondary | ICD-10-CM | POA: Diagnosis not present

## 2016-09-26 DIAGNOSIS — H524 Presbyopia: Secondary | ICD-10-CM | POA: Diagnosis not present

## 2016-09-26 LAB — HM DIABETES EYE EXAM

## 2016-10-02 DIAGNOSIS — E785 Hyperlipidemia, unspecified: Secondary | ICD-10-CM | POA: Diagnosis not present

## 2016-10-02 DIAGNOSIS — D631 Anemia in chronic kidney disease: Secondary | ICD-10-CM | POA: Diagnosis not present

## 2016-10-02 DIAGNOSIS — I2510 Atherosclerotic heart disease of native coronary artery without angina pectoris: Secondary | ICD-10-CM | POA: Diagnosis not present

## 2016-10-02 DIAGNOSIS — N184 Chronic kidney disease, stage 4 (severe): Secondary | ICD-10-CM | POA: Diagnosis not present

## 2016-10-02 DIAGNOSIS — I10 Essential (primary) hypertension: Secondary | ICD-10-CM | POA: Diagnosis not present

## 2016-10-02 DIAGNOSIS — Z794 Long term (current) use of insulin: Secondary | ICD-10-CM | POA: Diagnosis not present

## 2016-10-02 DIAGNOSIS — E1122 Type 2 diabetes mellitus with diabetic chronic kidney disease: Secondary | ICD-10-CM | POA: Diagnosis not present

## 2016-10-12 ENCOUNTER — Encounter: Payer: Self-pay | Admitting: Family Medicine

## 2016-10-30 ENCOUNTER — Other Ambulatory Visit: Payer: Self-pay | Admitting: Family Medicine

## 2016-11-10 DIAGNOSIS — I2510 Atherosclerotic heart disease of native coronary artery without angina pectoris: Secondary | ICD-10-CM | POA: Diagnosis not present

## 2016-11-10 DIAGNOSIS — I509 Heart failure, unspecified: Secondary | ICD-10-CM | POA: Diagnosis not present

## 2016-11-10 DIAGNOSIS — N183 Chronic kidney disease, stage 3 (moderate): Secondary | ICD-10-CM | POA: Diagnosis not present

## 2016-11-10 DIAGNOSIS — I10 Essential (primary) hypertension: Secondary | ICD-10-CM | POA: Diagnosis not present

## 2016-11-10 DIAGNOSIS — E7800 Pure hypercholesterolemia, unspecified: Secondary | ICD-10-CM | POA: Diagnosis not present

## 2016-12-04 ENCOUNTER — Other Ambulatory Visit: Payer: Self-pay | Admitting: Family Medicine

## 2016-12-04 DIAGNOSIS — H35342 Macular cyst, hole, or pseudohole, left eye: Secondary | ICD-10-CM | POA: Diagnosis not present

## 2016-12-04 MED ORDER — GLIPIZIDE 5 MG PO TABS: 5.0000 mg | ORAL_TABLET | Freq: Every day | ORAL | 4 refills | Status: AC

## 2016-12-05 DIAGNOSIS — E785 Hyperlipidemia, unspecified: Secondary | ICD-10-CM | POA: Diagnosis not present

## 2016-12-05 DIAGNOSIS — N184 Chronic kidney disease, stage 4 (severe): Secondary | ICD-10-CM | POA: Diagnosis not present

## 2016-12-05 DIAGNOSIS — I10 Essential (primary) hypertension: Secondary | ICD-10-CM | POA: Diagnosis not present

## 2016-12-05 DIAGNOSIS — E1122 Type 2 diabetes mellitus with diabetic chronic kidney disease: Secondary | ICD-10-CM | POA: Diagnosis not present

## 2016-12-05 DIAGNOSIS — I2510 Atherosclerotic heart disease of native coronary artery without angina pectoris: Secondary | ICD-10-CM | POA: Diagnosis not present

## 2016-12-14 DIAGNOSIS — I10 Essential (primary) hypertension: Secondary | ICD-10-CM | POA: Diagnosis not present

## 2016-12-14 DIAGNOSIS — N184 Chronic kidney disease, stage 4 (severe): Secondary | ICD-10-CM | POA: Diagnosis not present

## 2016-12-14 DIAGNOSIS — I2510 Atherosclerotic heart disease of native coronary artery without angina pectoris: Secondary | ICD-10-CM | POA: Diagnosis not present

## 2017-03-07 DIAGNOSIS — N184 Chronic kidney disease, stage 4 (severe): Secondary | ICD-10-CM | POA: Diagnosis not present

## 2017-03-07 DIAGNOSIS — E1122 Type 2 diabetes mellitus with diabetic chronic kidney disease: Secondary | ICD-10-CM | POA: Diagnosis not present

## 2017-03-07 DIAGNOSIS — E785 Hyperlipidemia, unspecified: Secondary | ICD-10-CM | POA: Diagnosis not present

## 2017-03-07 DIAGNOSIS — I2510 Atherosclerotic heart disease of native coronary artery without angina pectoris: Secondary | ICD-10-CM | POA: Diagnosis not present

## 2017-03-07 DIAGNOSIS — R7989 Other specified abnormal findings of blood chemistry: Secondary | ICD-10-CM | POA: Diagnosis not present

## 2017-03-07 DIAGNOSIS — I10 Essential (primary) hypertension: Secondary | ICD-10-CM | POA: Diagnosis not present

## 2017-03-07 DIAGNOSIS — D539 Nutritional anemia, unspecified: Secondary | ICD-10-CM | POA: Diagnosis not present

## 2017-06-06 DIAGNOSIS — N184 Chronic kidney disease, stage 4 (severe): Secondary | ICD-10-CM | POA: Diagnosis not present

## 2017-06-06 DIAGNOSIS — Z23 Encounter for immunization: Secondary | ICD-10-CM | POA: Diagnosis not present

## 2017-06-06 DIAGNOSIS — E785 Hyperlipidemia, unspecified: Secondary | ICD-10-CM | POA: Diagnosis not present

## 2017-06-06 DIAGNOSIS — E1122 Type 2 diabetes mellitus with diabetic chronic kidney disease: Secondary | ICD-10-CM | POA: Diagnosis not present

## 2017-06-06 DIAGNOSIS — I10 Essential (primary) hypertension: Secondary | ICD-10-CM | POA: Diagnosis not present

## 2017-06-06 DIAGNOSIS — D631 Anemia in chronic kidney disease: Secondary | ICD-10-CM | POA: Diagnosis not present

## 2017-06-06 DIAGNOSIS — I2510 Atherosclerotic heart disease of native coronary artery without angina pectoris: Secondary | ICD-10-CM | POA: Diagnosis not present

## 2017-06-07 ENCOUNTER — Other Ambulatory Visit: Payer: Self-pay

## 2017-06-18 DIAGNOSIS — I2510 Atherosclerotic heart disease of native coronary artery without angina pectoris: Secondary | ICD-10-CM | POA: Diagnosis not present

## 2017-06-18 DIAGNOSIS — D509 Iron deficiency anemia, unspecified: Secondary | ICD-10-CM | POA: Diagnosis not present

## 2017-06-18 DIAGNOSIS — N184 Chronic kidney disease, stage 4 (severe): Secondary | ICD-10-CM | POA: Diagnosis not present

## 2017-06-18 DIAGNOSIS — I10 Essential (primary) hypertension: Secondary | ICD-10-CM | POA: Diagnosis not present

## 2023-05-04 DEATH — deceased
